# Patient Record
Sex: Female | Born: 1960 | Race: White | Hispanic: Yes | Marital: Married | Smoking: Never smoker
Health system: Southern US, Community
[De-identification: ages and names within clinical notes are randomized; demographics above are authoritative.]

## PROBLEM LIST (undated history)

## (undated) DIAGNOSIS — T7840XA Allergy, unspecified, initial encounter: Secondary | ICD-10-CM

## (undated) DIAGNOSIS — J45909 Unspecified asthma, uncomplicated: Secondary | ICD-10-CM

## (undated) HISTORY — DX: Allergy, unspecified, initial encounter: T78.40XA

## (undated) HISTORY — DX: Unspecified asthma, uncomplicated: J45.909

---

## 1992-10-17 HISTORY — PX: TUBAL LIGATION: SHX77

## 2004-04-13 ENCOUNTER — Encounter: Admission: RE | Admit: 2004-04-13 | Discharge: 2004-04-13 | Payer: Self-pay | Admitting: Obstetrics and Gynecology

## 2004-05-03 ENCOUNTER — Other Ambulatory Visit: Admission: RE | Admit: 2004-05-03 | Discharge: 2004-05-03 | Payer: Self-pay | Admitting: Obstetrics and Gynecology

## 2005-12-06 ENCOUNTER — Encounter: Admission: RE | Admit: 2005-12-06 | Discharge: 2005-12-06 | Payer: Self-pay | Admitting: Otolaryngology

## 2008-05-30 ENCOUNTER — Other Ambulatory Visit: Admission: RE | Admit: 2008-05-30 | Discharge: 2008-05-30 | Payer: Self-pay | Admitting: Family Medicine

## 2009-06-02 ENCOUNTER — Other Ambulatory Visit: Admission: RE | Admit: 2009-06-02 | Discharge: 2009-06-02 | Payer: Self-pay | Admitting: Family Medicine

## 2010-07-20 ENCOUNTER — Other Ambulatory Visit: Admission: RE | Admit: 2010-07-20 | Discharge: 2010-07-20 | Payer: Self-pay | Admitting: Family Medicine

## 2010-08-02 ENCOUNTER — Encounter: Admission: RE | Admit: 2010-08-02 | Discharge: 2010-08-02 | Payer: Self-pay | Admitting: Family Medicine

## 2012-05-28 ENCOUNTER — Other Ambulatory Visit: Payer: Self-pay | Admitting: Family Medicine

## 2012-05-28 DIAGNOSIS — Z1231 Encounter for screening mammogram for malignant neoplasm of breast: Secondary | ICD-10-CM

## 2012-06-13 ENCOUNTER — Ambulatory Visit
Admission: RE | Admit: 2012-06-13 | Discharge: 2012-06-13 | Disposition: A | Discharge status: Home / Self Care | Payer: Managed Care, Other (non HMO) | Source: Ambulatory Visit | Attending: Family Medicine | Admitting: Family Medicine

## 2012-06-13 DIAGNOSIS — Z1231 Encounter for screening mammogram for malignant neoplasm of breast: Secondary | ICD-10-CM

## 2013-07-03 ENCOUNTER — Emergency Department (HOSPITAL_COMMUNITY): Payer: Managed Care, Other (non HMO)

## 2013-07-03 ENCOUNTER — Encounter (HOSPITAL_COMMUNITY): Payer: Self-pay | Admitting: Emergency Medicine

## 2013-07-03 ENCOUNTER — Emergency Department (HOSPITAL_COMMUNITY)
Admission: EM | Admit: 2013-07-03 | Discharge: 2013-07-04 | Disposition: A | Discharge status: Home / Self Care | Payer: Managed Care, Other (non HMO) | Attending: Emergency Medicine | Admitting: Emergency Medicine

## 2013-07-03 DIAGNOSIS — S4980XA Other specified injuries of shoulder and upper arm, unspecified arm, initial encounter: Secondary | ICD-10-CM | POA: Insufficient documentation

## 2013-07-03 DIAGNOSIS — S8990XA Unspecified injury of unspecified lower leg, initial encounter: Secondary | ICD-10-CM | POA: Insufficient documentation

## 2013-07-03 DIAGNOSIS — Y9389 Activity, other specified: Secondary | ICD-10-CM | POA: Insufficient documentation

## 2013-07-03 DIAGNOSIS — Z79899 Other long term (current) drug therapy: Secondary | ICD-10-CM | POA: Insufficient documentation

## 2013-07-03 DIAGNOSIS — S0993XA Unspecified injury of face, initial encounter: Secondary | ICD-10-CM | POA: Insufficient documentation

## 2013-07-03 DIAGNOSIS — Z7982 Long term (current) use of aspirin: Secondary | ICD-10-CM | POA: Insufficient documentation

## 2013-07-03 DIAGNOSIS — Z91040 Latex allergy status: Secondary | ICD-10-CM | POA: Insufficient documentation

## 2013-07-03 DIAGNOSIS — Z3202 Encounter for pregnancy test, result negative: Secondary | ICD-10-CM | POA: Insufficient documentation

## 2013-07-03 DIAGNOSIS — S46909A Unspecified injury of unspecified muscle, fascia and tendon at shoulder and upper arm level, unspecified arm, initial encounter: Secondary | ICD-10-CM | POA: Insufficient documentation

## 2013-07-03 DIAGNOSIS — Y92410 Unspecified street and highway as the place of occurrence of the external cause: Secondary | ICD-10-CM | POA: Insufficient documentation

## 2013-07-03 DIAGNOSIS — IMO0002 Reserved for concepts with insufficient information to code with codable children: Secondary | ICD-10-CM | POA: Insufficient documentation

## 2013-07-03 LAB — POCT PREGNANCY, URINE: Preg Test, Ur: NEGATIVE

## 2013-07-03 MED ORDER — IOHEXOL 300 MG/ML  SOLN
80.0000 mL | Freq: Once | INTRAMUSCULAR | Status: AC | PRN
  Administered 2013-07-03: 80 mL via INTRAVENOUS

## 2013-07-03 MED ORDER — MORPHINE SULFATE 4 MG/ML IJ SOLN
4.0000 mg | Freq: Once | INTRAMUSCULAR | Status: DC
  Filled 2013-07-03: qty 1

## 2013-07-03 MED ORDER — ONDANSETRON HCL 4 MG/2ML IJ SOLN
4.0000 mg | Freq: Once | INTRAMUSCULAR | Status: AC
Start: 2013-07-03 — End: 2013-07-03
  Administered 2013-07-03: 4 mg via INTRAVENOUS
  Filled 2013-07-03: qty 2

## 2013-07-03 MED ORDER — HYDROMORPHONE HCL PF 1 MG/ML IJ SOLN
1.0000 mg | Freq: Once | INTRAMUSCULAR | Status: AC
  Administered 2013-07-03: 1 mg via INTRAVENOUS
  Filled 2013-07-03: qty 1

## 2013-07-03 NOTE — ED Notes (Signed)
Pt vomiting. Sat up in bed to avoid aspiration.
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
## 2013-07-03 NOTE — ED Notes (Signed)
Rn attempted IV start. 2nd Rn will try for access.
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
## 2013-07-04 MED ORDER — ONDANSETRON 4 MG PO TBDP: 4.0000 mg | ORAL_TABLET | Freq: Three times a day (TID) | ORAL | Status: DC | PRN

## 2013-07-04 MED ORDER — OXYCODONE-ACETAMINOPHEN 5-325 MG PO TABS: 1.0000 | ORAL_TABLET | ORAL | Status: DC | PRN

## 2013-07-05 NOTE — ED Provider Notes (Signed)
Medical screening examination/treatment/procedure(s) were performed by non-physician practitioner and as supervising physician I was immediately available for consultation/collaboration.   Shelda Jakes, MD 07/05/13 [phone]

## 2013-08-02 ENCOUNTER — Other Ambulatory Visit: Payer: Self-pay | Admitting: Family Medicine

## 2013-08-02 ENCOUNTER — Other Ambulatory Visit (HOSPITAL_COMMUNITY)
Admission: RE | Admit: 2013-08-02 | Discharge: 2013-08-02 | Disposition: A | Discharge status: Home / Self Care | Payer: Managed Care, Other (non HMO) | Source: Ambulatory Visit | Attending: Family Medicine | Admitting: Family Medicine

## 2013-08-02 DIAGNOSIS — Z124 Encounter for screening for malignant neoplasm of cervix: Secondary | ICD-10-CM | POA: Insufficient documentation

## 2013-08-02 DIAGNOSIS — Z1151 Encounter for screening for human papillomavirus (HPV): Secondary | ICD-10-CM | POA: Insufficient documentation

## 2014-07-23 ENCOUNTER — Other Ambulatory Visit: Payer: Self-pay

## 2014-07-23 DIAGNOSIS — Z1231 Encounter for screening mammogram for malignant neoplasm of breast: Secondary | ICD-10-CM

## 2014-07-24 ENCOUNTER — Ambulatory Visit: Payer: Managed Care, Other (non HMO)

## 2014-08-01 ENCOUNTER — Ambulatory Visit
Admission: RE | Admit: 2014-08-01 | Discharge: 2014-08-01 | Disposition: A | Discharge status: Home / Self Care | Payer: Managed Care, Other (non HMO) | Source: Ambulatory Visit

## 2014-08-01 DIAGNOSIS — Z1231 Encounter for screening mammogram for malignant neoplasm of breast: Secondary | ICD-10-CM

## 2014-08-06 ENCOUNTER — Other Ambulatory Visit: Payer: Self-pay | Admitting: Family Medicine

## 2014-08-06 DIAGNOSIS — R928 Other abnormal and inconclusive findings on diagnostic imaging of breast: Secondary | ICD-10-CM

## 2014-08-18 ENCOUNTER — Ambulatory Visit
Admission: RE | Admit: 2014-08-18 | Discharge: 2014-08-18 | Disposition: A | Discharge status: Home / Self Care | Payer: Managed Care, Other (non HMO) | Source: Ambulatory Visit | Attending: Family Medicine | Admitting: Family Medicine

## 2014-08-18 DIAGNOSIS — R928 Other abnormal and inconclusive findings on diagnostic imaging of breast: Secondary | ICD-10-CM

## 2014-12-21 ENCOUNTER — Ambulatory Visit (INDEPENDENT_AMBULATORY_CARE_PROVIDER_SITE_OTHER): Payer: Managed Care, Other (non HMO) | Admitting: Physician Assistant

## 2014-12-21 VITALS — BP 122/78 | HR 78 | Temp 99.5°F | Resp 18 | Ht 60.0 in | Wt 162.0 lb

## 2014-12-21 DIAGNOSIS — J029 Acute pharyngitis, unspecified: Secondary | ICD-10-CM

## 2014-12-21 DIAGNOSIS — J019 Acute sinusitis, unspecified: Secondary | ICD-10-CM

## 2014-12-21 LAB — POCT RAPID STREP A (OFFICE): RAPID STREP A SCREEN: NEGATIVE

## 2014-12-21 MED ORDER — BENZONATATE 100 MG PO CAPS: 100.0000 mg | ORAL_CAPSULE | Freq: Three times a day (TID) | ORAL | Status: DC | PRN

## 2014-12-21 MED ORDER — IPRATROPIUM BROMIDE 0.03 % NA SOLN: 2.0000 | Freq: Two times a day (BID) | NASAL | Status: DC

## 2014-12-21 NOTE — Progress Notes (Signed)
Subjective:    Patient ID: Karen Ellis, female    DOB: [birth year], 54 y.o.   MRN: [id number]  HPI Patient presents for sore throat and fever that have been present for 2 days. Sore throat has gotten worse to the point that she doesn't want to drink water due to pain. Fevers have been 101 and 102 degrees and have improved with Tylenol, but have not resolved. This morning now has sinus pressure, right ear pressure, has some cough and congestion. 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 Has additionally tried Mucinex with minimal relief. Has h/o allergies, but not asthma. Is not a smoker. Med allergies to morphine and latex.    Review of Systems  Constitutional: Positive for fever and fatigue. Negative for chills, diaphoresis and appetite change.  HENT: Positive for congestion, ear pain, sinus pressure, sore throat and trouble swallowing. Negative for postnasal drip, rhinorrhea and sneezing.   Respiratory: Positive for cough. Negative for shortness of breath.   Cardiovascular: Negative for chest pain.  Gastrointestinal: Negative for nausea and vomiting.  Musculoskeletal: Negative for neck pain and neck stiffness.  Allergic/Immunologic: Positive for environmental allergies. Negative for food allergies.  Neurological: Positive for headaches.  Hematological: Positive for adenopathy.       Objective:   Physical Exam  Constitutional: She is oriented to person, place, and time. She appears well-developed and well-nourished. No distress.  Blood pressure 122/78, pulse 78, temperature 99.5 F (37.5 C), temperature source Oral, resp. rate 18, height 5' (1.524 m), weight 162 lb (73.483 kg), SpO2 97 %.  HENT:  Head: Normocephalic and atraumatic.  Right Ear: Tympanic membrane, external ear and ear canal normal. No swelling or tenderness. Tympanic membrane is not injected, not perforated, not erythematous, not retracted and not bulging. No middle ear effusion.  Left Ear:  External ear and ear canal normal. No swelling or tenderness. Tympanic membrane is erythematous. Tympanic membrane is not injected, not perforated, not retracted and not bulging.  No middle ear effusion.  Nose: Rhinorrhea (with moderate erythema) present. No sinus tenderness. Right sinus exhibits maxillary sinus tenderness. Right sinus exhibits no frontal sinus tenderness. Left sinus exhibits maxillary sinus tenderness. Left sinus exhibits no frontal sinus tenderness.  Mouth/Throat: Uvula is midline and mucous membranes are normal. Posterior oropharyngeal erythema (mild) present. No oropharyngeal exudate or posterior oropharyngeal edema.  2+ hypertrophic tonsils bilaterally without exudate.  Eyes: Conjunctivae are normal. Pupils are equal, round, and reactive to light. Right eye exhibits no discharge. Left eye exhibits no discharge. No scleral icterus.  Neck: Normal range of motion. Neck supple. No thyromegaly present.  Cardiovascular: Normal rate, regular rhythm and normal heart sounds.  Exam reveals no gallop and no friction rub.   No murmur heard. Pulmonary/Chest: Effort normal and breath sounds normal. No respiratory distress. She has no decreased breath sounds. She has no wheezes. She has no rhonchi. She has no rales.  Lymphadenopathy:    She has cervical adenopathy.  Neurological: She is alert and oriented to person, place, and time.  Skin: Skin is warm and dry. No rash noted. She is not diaphoretic. No erythema. No pallor.   Results for orders placed or performed in visit on 12/21/14  POCT rapid strep A  Result Value Ref Range   Rapid Strep A Screen Negative Negative      Assessment & Plan:  1. Sore throat - POCT rapid strep A - Culture, Group A Strep  2. Acute sinusitis, recurrence not specified, unspecified  location Can use ibuprofen for fevers and pain. Continue Mucinex with plenty of water. - ipratropium (ATROVENT) 0.03 % nasal spray; Place 2 sprays into both nostrils 2 (two)  times daily.  Dispense: 30 mL; Refill: 0 - benzonatate (TESSALON) 100 MG capsule; Take 1-2 capsules (100-200 mg total) by mouth 3 (three) times daily as needed for cough.  Dispense: 40 capsule; Refill: 0   Dejuan Elman PA-C  [facility] 12/21/2014 9:19 AM

## 2014-12-21 NOTE — Patient Instructions (Signed)

## 2014-12-22 LAB — CULTURE, GROUP A STREP: ORGANISM ID, BACTERIA: NORMAL

## 2014-12-23 ENCOUNTER — Encounter: Payer: Self-pay | Admitting: Physician Assistant

## 2015-07-28 ENCOUNTER — Other Ambulatory Visit: Payer: Self-pay

## 2015-07-28 ENCOUNTER — Other Ambulatory Visit: Payer: Self-pay | Admitting: Family Medicine

## 2015-07-28 DIAGNOSIS — Z1231 Encounter for screening mammogram for malignant neoplasm of breast: Secondary | ICD-10-CM

## 2015-08-21 ENCOUNTER — Ambulatory Visit
Admission: RE | Admit: 2015-08-21 | Discharge: 2015-08-21 | Disposition: A | Discharge status: Home / Self Care | Payer: Managed Care, Other (non HMO) | Source: Ambulatory Visit

## 2015-08-21 DIAGNOSIS — Z1231 Encounter for screening mammogram for malignant neoplasm of breast: Secondary | ICD-10-CM

## 2015-08-21 LAB — BASIC METABOLIC PANEL
BUN: 11 mg/dL (ref 4–21)
Creatinine: 0.7 mg/dL (ref 0.5–1.1)
GLUCOSE: 89 mg/dL
POTASSIUM: 4.6 mmol/L (ref 3.4–5.3)
SODIUM: 140 mmol/L (ref 137–147)

## 2015-08-21 LAB — LIPID PANEL
Cholesterol: 203 mg/dL — AB (ref 0–200)
HDL: 68 mg/dL (ref 35–70)
LDL Cholesterol: 116 mg/dL
Triglycerides: 94 mg/dL (ref 40–160)

## 2015-08-21 LAB — HEPATIC FUNCTION PANEL
ALT: 27 U/L (ref 7–35)
AST: 24 U/L (ref 13–35)
Bilirubin, Total: 0.4 mg/dL

## 2015-08-26 ENCOUNTER — Other Ambulatory Visit: Payer: Self-pay | Admitting: Family Medicine

## 2015-08-26 DIAGNOSIS — R928 Other abnormal and inconclusive findings on diagnostic imaging of breast: Secondary | ICD-10-CM

## 2015-09-18 ENCOUNTER — Other Ambulatory Visit: Payer: Managed Care, Other (non HMO)

## 2015-10-01 ENCOUNTER — Ambulatory Visit
Admission: RE | Admit: 2015-10-01 | Discharge: 2015-10-01 | Disposition: A | Discharge status: Home / Self Care | Payer: Managed Care, Other (non HMO) | Source: Ambulatory Visit | Attending: Family Medicine | Admitting: Family Medicine

## 2015-10-01 DIAGNOSIS — R928 Other abnormal and inconclusive findings on diagnostic imaging of breast: Secondary | ICD-10-CM

## 2017-01-30 ENCOUNTER — Encounter: Payer: Self-pay | Admitting: Family Medicine

## 2017-01-30 ENCOUNTER — Ambulatory Visit (INDEPENDENT_AMBULATORY_CARE_PROVIDER_SITE_OTHER): Payer: Managed Care, Other (non HMO) | Admitting: Family Medicine

## 2017-01-30 VITALS — BP 118/70 | HR 68 | Temp 98.0°F | Resp 12 | Ht 60.0 in | Wt 169.2 lb

## 2017-01-30 DIAGNOSIS — M25541 Pain in joints of right hand: Secondary | ICD-10-CM

## 2017-01-30 DIAGNOSIS — E559 Vitamin D deficiency, unspecified: Secondary | ICD-10-CM | POA: Diagnosis not present

## 2017-01-30 DIAGNOSIS — E6609 Other obesity due to excess calories: Secondary | ICD-10-CM

## 2017-01-30 DIAGNOSIS — Z6833 Body mass index (BMI) 33.0-33.9, adult: Secondary | ICD-10-CM | POA: Diagnosis not present

## 2017-01-30 DIAGNOSIS — E669 Obesity, unspecified: Secondary | ICD-10-CM | POA: Insufficient documentation

## 2017-01-30 LAB — TSH: TSH: 1.59 u[IU]/mL (ref 0.35–4.50)

## 2017-01-30 LAB — C-REACTIVE PROTEIN: CRP: 0.3 mg/dL — ABNORMAL LOW (ref 0.5–20.0)

## 2017-01-30 LAB — SEDIMENTATION RATE: Sed Rate: 6 mm/hr (ref 0–30)

## 2017-01-30 LAB — VITAMIN D 25 HYDROXY (VIT D DEFICIENCY, FRACTURES): VITD: 48.25 ng/mL (ref 30.00–100.00)

## 2017-01-30 NOTE — Progress Notes (Signed)
Pre visit review using our clinic review tool, if applicable. No additional management support is needed unless otherwise documented below in the visit note. 

## 2017-01-30 NOTE — Patient Instructions (Addendum)
WE NOW OFFER    [facility] FAST TRACK!!!  SAME DAY Appointments for ACUTE CARE  Such as: Sprains, Injuries, cuts, abrasions, rashes, muscle pain, joint pain, back pain Colds, flu, sore throats, headache, allergies, cough, fever  Ear pain, sinus and eye infections Abdominal pain, nausea, vomiting, diarrhea, upset stomach Animal/insect bites  3 Easy Ways to Schedule: Walk-In Scheduling Call in scheduling Mychart Sign-up: [email or web address]   A few things to remember from today's visit:   Arthralgia of right hand - Plan: TSH, Sedimentation rate, C-reactive protein, Rheumatoid factor, Cyclic citrul peptide antibody, IgG  Vitamin D deficiency - Plan: VITAMIN D 25 Hydroxy (Vit-D Deficiency, Fractures)  ? Osteoarthritis.  Osteoarthritis is a chronic condition and gets worse with age.  The following may help:  Over the counter topical medications: Icy Hot or Asper cream with Lidocaine. Tai Chi or PT. Fall prevention. Avoid weight gain. Fish oil, over the counter Megared for example, 2 capsules daily.   Tumeric caps     Please be sure medication list is accurate. If a new problem present, please set up appointment sooner than planned today.

## 2017-01-30 NOTE — Progress Notes (Signed)
HPI:   [name] is a 56 y.o. female, who is here today to establish care.  Former PCP: Dr Yehuda Mao Last preventive routine visit: 2016. Colonoscopy at 56 yo, 10 years f/u recommended.  Chronic medical problems: Allergies, asthma, and Vit D deficiency among some.  Vit D deficiency,she is on OTC Vit D 1000 U daily, which she started after completing 8 weeks of Ergocalciferol 50,000 U weekly.   Concerns today:   4 days of joint pain: wrist and PIP joint of both hands, R>L. Mild edema on some joints. She denies erythema or limitations of ROM. Pain exacerbated by movement and alleviated by rest. + Stiffness,worse in the morning and alleviated by movement. No Hx of injuries. OTC Tylenol has helped. No Hx of OA. Pain is like achy,it was 8/10 and now 4/10.   She tries to follow a healthy diet,does not exercise regularly. She denies Hx of HTN,DM,or HLD.    Review of Systems  Constitutional: Negative for activity change, appetite change, fatigue, fever and unexpected weight change.  HENT: Negative for mouth sores, nosebleeds and trouble swallowing.   Eyes: Negative for redness and visual disturbance.  Respiratory: Negative for cough, shortness of breath and wheezing.   Cardiovascular: Negative for chest pain, palpitations and leg swelling.  Gastrointestinal: Negative for abdominal pain, nausea and vomiting.       Negative for changes in bowel habits.  Endocrine: Negative for cold intolerance and heat intolerance.  Genitourinary: Negative for decreased urine volume and hematuria.  Musculoskeletal: Positive for arthralgias and joint swelling. Negative for back pain, gait problem and myalgias.  Skin: Negative for rash.  Allergic/Immunologic: Positive for environmental allergies.  Neurological: Negative for syncope, weakness, numbness and headaches.  Psychiatric/Behavioral: Negative for confusion and sleep disturbance. The patient is not nervous/anxious.        Current Outpatient Prescriptions on File Prior to Visit  Medication Sig Dispense Refill  . aspirin EC 81 MG tablet Take 81 mg by mouth daily.    . cholecalciferol (VITAMIN D) 1000 UNITS tablet Take 1,000 Units by mouth daily.    . fish oil-omega-3 fatty acids 1000 MG capsule Take 1 g by mouth daily.    Marland Kitchen loratadine (CLARITIN) 10 MG tablet Take 10 mg by mouth as needed for allergies.     No current facility-administered medications on file prior to visit.      Past Medical History:  Diagnosis Date  . Allergy   . Asthma    Allergies  Allergen Reactions  . Morphine And Related   . Latex Rash    Family History  Problem Relation Age of Onset  . Heart disease Father 64    CAD  . Heart disease Brother     arrhythmias  . Heart disease Daughter     CAD  . Heart disease Brother 68    CAD    Social History   Social History  . Marital status: Married    Spouse name: N/A  . Number of children: N/A  . Years of education: N/A   Social History Main Topics  . Smoking status: Never Smoker  . Smokeless tobacco: Never Used  . Alcohol use Yes     Comment: occasionally  . Drug use: No  . Sexual activity: Not Asked   Other Topics Concern  . None   Social History Narrative  . None    Vitals:   01/30/17 0948  BP: 118/70  Pulse: 68  Resp: 12  Temp: 98 F (36.7 C)   Body mass index is 33.04 kg/m.   Physical Exam  Nursing note and vitals reviewed. Constitutional: She is oriented to person, place, and time. She appears well-developed. No distress.  HENT:  Head: Atraumatic.  Mouth/Throat: Oropharynx is clear and moist and mucous membranes are normal.  Eyes: Conjunctivae and EOM are normal. Pupils are equal, round, and reactive to light.  Cardiovascular: Normal rate and regular rhythm.   No murmur heard. Pulses:      Dorsalis pedis pulses are 2+ on the right side, and 2+ on the left side.  Respiratory: Effort normal and breath sounds normal. No respiratory  distress.  GI: Soft. She exhibits no mass. There is no hepatomegaly. There is no tenderness.  Musculoskeletal: She exhibits no edema.       Right hand: She exhibits tenderness. She exhibits normal range of motion.       Left hand: She exhibits tenderness. She exhibits normal range of motion. Decreased sensation is not present in the ulnar distribution and is not present in the radial distribution.  Wrist no tenderness with ROM or palpation. No signs of synovitis. Some IP joints with palpable nodular lesions. No muscle atrophy appreciated.  Lymphadenopathy:    She has no cervical adenopathy.  Neurological: She is alert and oriented to person, place, and time. She has normal strength. Coordination and gait normal.  Skin: Skin is warm. No rash noted. No erythema.  Psychiatric: She has a normal mood and affect.  Well groomed, good eye contact.    ASSESSMENT AND PLAN:   Karen Ellis was seen today for establish care.  Diagnoses and all orders for this visit:  Arthralgia of right hand  We discussed possible etiologies. ? OA. Continue Tylenol 650 mg tid as needed. Further recommendations will be given according to lab results.  -     TSH -     Sedimentation rate -     C-reactive protein -     Rheumatoid factor -     Cyclic citrul peptide antibody, IgG  Vitamin D deficiency  No changes in current management, will follow labs done today and will give further recommendations accordingly. F/U in 6-12 months.  -     VITAMIN D 25 Hydroxy (Vit-D Deficiency, Fractures)  Class 1 obesity due to excess calories without serious comorbidity with body mass index (BMI) of 33.0 to 33.9 in adult  We discussed benefits of wt loss as well as adverse effects of obesity, more so given her strong FHx for CVD Consistency with healthy diet and physical activity recommended. Daily brisk walking for 15-30 min as tolerated.     Karen Ellis G. [place], MD  [facility]. [place]  office.

## 2017-01-31 ENCOUNTER — Encounter: Payer: Self-pay | Admitting: Family Medicine

## 2017-01-31 LAB — CYCLIC CITRUL PEPTIDE ANTIBODY, IGG: Cyclic Citrullin Peptide Ab: <16 Units

## 2017-01-31 LAB — RHEUMATOID FACTOR

## 2017-03-26 NOTE — Progress Notes (Signed)
HPI:   Karen Ellis is a 55 y.o. female, who is here today for her routine physical.  Regular exercise 3 or more time per week: Started back in 01/2017 Following a healthy diet: Improved her diet, has noted wt loss.  She lives with husbands.  Chronic medical problems: Vit D deficiency,obesity, asthma,allergies, and right hand arthralgias. Last seen on 01/30/17.   Pap smear 07/2013 negative with HPV neg. Hx of abnormal pap smears: .28 years ago post partum abnormal pap smear and copoBx neg. No abnormalities since then. Hx of STD's: Negative.  Sexually active.  Immunization History  Administered Date(s) Administered  . Pneumococcal Polysaccharide-23 07/09/2012  . Tdap 05/25/2007    Mammogram: [facility] 1 in 09/2015. Received letter and has appt in 04/2017. Colonoscopy: 7 years ago, reported as negative. DEXA: 2009, negative.  Hep C screening: Not done in the past. Last eye exam 01/2016, eye glasses for driving. Dental app in 05/2017.   Review of Systems  Constitutional: Negative for activity change, appetite change, fatigue and fever.  HENT: Negative for dental problem, hearing loss, mouth sores, trouble swallowing and voice change.   Eyes: Negative for redness and visual disturbance.  Respiratory: Negative for cough, shortness of breath and wheezing.   Cardiovascular: Negative for chest pain, palpitations and leg swelling.  Gastrointestinal: Negative for abdominal pain, blood in stool, nausea and vomiting.       No changes in bowel habits.  Endocrine: Negative for cold intolerance, heat intolerance, polydipsia, polyphagia and polyuria.  Genitourinary: Negative for decreased urine volume, dysuria, hematuria, vaginal bleeding and vaginal discharge.  Musculoskeletal: Negative for gait problem and myalgias.  Skin: Negative for color change and rash.  Allergic/Immunologic: Positive for environmental allergies.  Neurological: Negative for syncope, weakness and  headaches.  Hematological: Negative for adenopathy. Does not bruise/bleed easily.  Psychiatric/Behavioral: Negative for confusion and sleep disturbance. The patient is not nervous/anxious.   All other systems reviewed and are negative.     Current Outpatient Prescriptions on File Prior to Visit  Medication Sig Dispense Refill  . albuterol (PROAIR HFA) 108 (90 Base) MCG/ACT inhaler Inhale into the lungs.    Marland Kitchen aspirin EC 81 MG tablet Take 81 mg by mouth daily.    . cholecalciferol (VITAMIN D) 1000 UNITS tablet Take 1,000 Units by mouth daily.    . fish oil-omega-3 fatty acids 1000 MG capsule Take 1 g by mouth daily.    Marland Kitchen loratadine (CLARITIN) 10 MG tablet Take 10 mg by mouth as needed for allergies.     No current facility-administered medications on file prior to visit.      Past Medical History:  Diagnosis Date  . Allergy   . Asthma     Allergies  Allergen Reactions  . Morphine And Related   . Latex Rash    Family History  Problem Relation Age of Onset  . Heart disease Father 31       CAD  . Heart disease Brother        arrhythmias  . Heart disease Daughter        CAD  . Heart disease Brother 53       CAD    Social History   Social History  . Marital status: Married    Spouse name: N/A  . Number of children: N/A  . Years of education: N/A   Social History Main Topics  . Smoking status: Never Smoker  . Smokeless tobacco: Never Used  . Alcohol  use Yes     Comment: occasionally  . Drug use: No  . Sexual activity: Not Asked   Other Topics Concern  . None   Social History Narrative  . None    Vitals:   03/27/17 0954  BP: 118/80  Pulse: 70  Resp: 12   Body mass index is 32.05 kg/m.  O2 sat at RA 97%  Wt Readings from Last 3 Encounters:  03/27/17 164 lb 2 oz (74.4 kg)  01/30/17 169 lb 3.2 oz (76.7 kg)  12/21/14 162 lb (73.5 kg)    Physical Exam  Nursing note and vitals reviewed. Constitutional: She is oriented to person, place, and time.  She appears well-developed. No distress.  HENT:  Head: Atraumatic.  Right Ear: Hearing, tympanic membrane, external ear and ear canal normal.  Left Ear: Hearing, external ear and ear canal normal. Tympanic membrane is scarred (mild).  Mouth/Throat: Uvula is midline, oropharynx is clear and moist and mucous membranes are normal.  Eyes: Conjunctivae and EOM are normal. Pupils are equal, round, and reactive to light.  Neck: No tracheal deviation present. No thyroid mass and no thyromegaly present.  Cardiovascular: Normal rate and regular rhythm.   No murmur heard. Pulses:      Dorsalis pedis pulses are 2+ on the right side, and 2+ on the left side.  Respiratory: Effort normal and breath sounds normal. No respiratory distress.  GI: Soft. She exhibits no mass. There is no hepatomegaly. There is no tenderness.  Genitourinary: No breast swelling or tenderness.  Genitourinary Comments: Breast: no masses,skin changes, or nipple discharge appreciated.  Musculoskeletal: She exhibits no edema.  No major deformity or signs of synovitis appreciated.  Lymphadenopathy:    She has no cervical adenopathy.    She has no axillary adenopathy.       Right: No supraclavicular adenopathy present.       Left: No supraclavicular adenopathy present.  Neurological: She is alert and oriented to person, place, and time. She has normal strength. No cranial nerve deficit. Coordination and gait normal.  Reflex Scores:      Bicep reflexes are 2+ on the right side and 2+ on the left side.      Patellar reflexes are 2+ on the right side and 2+ on the left side. Skin: Skin is warm. No rash noted. No erythema.  Psychiatric: She has a normal mood and affect. Her speech is normal.  Well groomed, good eye contact.      ASSESSMENT AND PLAN:   Karen Ellis was seen today for annual exam.  Diagnoses and all orders for this visit:  Lab Results  Component Value Date   CHOL 180 03/27/2017   HDL 48.20 03/27/2017   LDLCALC 102  (H) 03/27/2017   TRIG 149.0 03/27/2017   CHOLHDL 4 03/27/2017   Lab Results  Component Value Date   CREATININE 0.63 03/27/2017   BUN 12 03/27/2017   NA 138 03/27/2017   K 4.0 03/27/2017   CL 103 03/27/2017   CO2 27 03/27/2017    Routine general medical examination at a health care facility   We discussed the importance of regular physical activity and healthy diet for prevention of chronic illness and/or complications. Preventive guidelines reviewed. Vaccination up to date, due for Tdap and Pneumovax in 06/2017.  Ca++ and vit D supplementation recommended. Next CPE in 1 year.   -     Basic metabolic panel -     Lipid panel  Lipid screening -  Lipid panel  Diabetes mellitus screening -     Basic metabolic panel  Breast cancer screening  She has appt for mammogram in 04/2017.  Colon cancer screening  Immunochemical stool cards given x 3.  Encounter for HCV screening test for high risk patient -     Hepatitis C antibody screen     Return in 1 year (on 03/27/2018) for CPE with pap smear..      Betty G. [place], MD  [facility]. [place] office.

## 2017-03-27 ENCOUNTER — Ambulatory Visit (INDEPENDENT_AMBULATORY_CARE_PROVIDER_SITE_OTHER): Payer: Managed Care, Other (non HMO) | Admitting: Family Medicine

## 2017-03-27 ENCOUNTER — Encounter: Payer: Self-pay | Admitting: Family Medicine

## 2017-03-27 VITALS — BP 118/80 | HR 70 | Resp 12 | Ht 60.0 in | Wt 164.1 lb

## 2017-03-27 DIAGNOSIS — Z1159 Encounter for screening for other viral diseases: Secondary | ICD-10-CM | POA: Diagnosis not present

## 2017-03-27 DIAGNOSIS — Z1231 Encounter for screening mammogram for malignant neoplasm of breast: Secondary | ICD-10-CM

## 2017-03-27 DIAGNOSIS — Z1211 Encounter for screening for malignant neoplasm of colon: Secondary | ICD-10-CM | POA: Diagnosis not present

## 2017-03-27 DIAGNOSIS — Z0000 Encounter for general adult medical examination without abnormal findings: Secondary | ICD-10-CM | POA: Diagnosis not present

## 2017-03-27 DIAGNOSIS — Z9189 Other specified personal risk factors, not elsewhere classified: Secondary | ICD-10-CM

## 2017-03-27 DIAGNOSIS — Z131 Encounter for screening for diabetes mellitus: Secondary | ICD-10-CM | POA: Diagnosis not present

## 2017-03-27 DIAGNOSIS — Z13220 Encounter for screening for lipoid disorders: Secondary | ICD-10-CM

## 2017-03-27 DIAGNOSIS — Z1239 Encounter for other screening for malignant neoplasm of breast: Secondary | ICD-10-CM

## 2017-03-27 LAB — BASIC METABOLIC PANEL
BUN: 12 mg/dL (ref 6–23)
CO2: 27 meq/L (ref 19–32)
CREATININE: 0.63 mg/dL (ref 0.40–1.20)
Calcium: 9.8 mg/dL (ref 8.4–10.5)
Chloride: 103 mEq/L (ref 96–112)
GFR: 103.22 mL/min (ref >60.00)
Glucose, Bld: 82 mg/dL (ref 70–99)
Potassium: 4 mEq/L (ref 3.5–5.1)
Sodium: 138 mEq/L (ref 135–145)

## 2017-03-27 LAB — LIPID PANEL
CHOL/HDL RATIO: 4
Cholesterol: 180 mg/dL (ref 0–200)
HDL: 48.2 mg/dL (ref >39.00)
LDL Cholesterol: 102 mg/dL — ABNORMAL HIGH (ref 0–99)
NonHDL: 131.76
Triglycerides: 149 mg/dL (ref 0.0–149.0)
VLDL: 29.8 mg/dL (ref 0.0–40.0)

## 2017-03-27 NOTE — Patient Instructions (Addendum)
A few things to remember from today's visit:   Lipid screening - Plan: Lipid panel  Diabetes mellitus screening - Plan: Basic metabolic panel  Breast cancer screening  Colon cancer screening  Encounter for HCV screening test for high risk patient - Plan: Hepatitis C antibody screen  Routine general medical examination at a health care facility - Plan: Basic metabolic panel, Lipid panel    At least 150 minutes of moderate exercise per week, daily brisk walking for 15-30 min is a good exercise option. Healthy diet low in saturated (animal) fats and sweets and consisting of fresh fruits and vegetables, lean meats such as fish and white chicken and whole grains.   - Vaccines:  Tdap vaccine every 10 years. In 06/2017 Pneumovax (asthma) and Tdap.   Shingles vaccine recommended at age 58, could be given after 58 years of age but not sure about insurance coverage.  Pneumonia vaccines:  Prevnar 13 at 65 and Pneumovax at 66.  Screening recommendations for low/normal risk women:  Screening for diabetes at age 58-45 and every 3 years.  Cervical cancer prevention:   Due in 2019.   -Breast cancer: Mammogram: There is disagreement between experts about when to start screening in low risk asymptomatic female but recent recommendations are to start screening at 2640 and not later than 58 years old , every 1-2 years and after 58 yo q 2 years. Screening is recommended until 58 years old but some women can continue screening depending of healthy issues.   Colon cancer screening: starts at 58 years old until 58 years old.  Cholesterol disorder screening at age 58 and every 3 years.  Also recommended:  1. Dental visit- Brush and floss your teeth twice daily; visit your dentist twice a year. 2. Eye doctor- Get an eye exam at least every 2 years. 3. Helmet use- Always wear a helmet when riding a bicycle, motorcycle, rollerblading or skateboarding. 4. Safe sex- If you may be exposed to  sexually transmitted infections, use a condom. 5. Seat belts- Seat belts can save your live; always wear one. 6. Smoke/Carbon Monoxide detectors- These detectors need to be installed on the appropriate level of your home. Replace batteries at least once a year. 7. Skin cancer- When out in the sun please cover up and use sunscreen 15 SPF or higher. 8. Violence- If anyone is threatening or hurting you, please tell your healthcare provider.  9. Drink alcohol in moderation- Limit alcohol intake to one drink or less per day. Never drink and drive.  Please be sure medication list is accurate. If a new problem present, please set up appointment sooner than planned today.

## 2017-03-28 LAB — HEPATITIS C ANTIBODY: HCV AB: NEGATIVE

## 2017-03-29 ENCOUNTER — Encounter: Payer: Self-pay | Admitting: Family Medicine

## 2017-03-30 ENCOUNTER — Encounter: Payer: Self-pay | Admitting: Family Medicine

## 2017-07-06 ENCOUNTER — Encounter: Payer: Self-pay | Admitting: Family Medicine

## 2017-07-18 ENCOUNTER — Other Ambulatory Visit: Payer: Self-pay | Admitting: Family Medicine

## 2017-07-18 DIAGNOSIS — Z1239 Encounter for other screening for malignant neoplasm of breast: Secondary | ICD-10-CM

## 2017-07-26 ENCOUNTER — Ambulatory Visit: Payer: Managed Care, Other (non HMO) | Admitting: Family Medicine

## 2017-07-26 ENCOUNTER — Encounter: Payer: Self-pay | Admitting: Family Medicine

## 2017-07-26 ENCOUNTER — Ambulatory Visit (INDEPENDENT_AMBULATORY_CARE_PROVIDER_SITE_OTHER): Payer: Managed Care, Other (non HMO) | Admitting: Family Medicine

## 2017-07-26 VITALS — BP 110/80 | HR 94 | Temp 98.3°F | Resp 12 | Ht 60.0 in | Wt 169.0 lb

## 2017-07-26 DIAGNOSIS — R05 Cough: Secondary | ICD-10-CM | POA: Diagnosis not present

## 2017-07-26 DIAGNOSIS — J45901 Unspecified asthma with (acute) exacerbation: Secondary | ICD-10-CM

## 2017-07-26 DIAGNOSIS — J069 Acute upper respiratory infection, unspecified: Secondary | ICD-10-CM | POA: Diagnosis not present

## 2017-07-26 DIAGNOSIS — R059 Cough, unspecified: Secondary | ICD-10-CM

## 2017-07-26 MED ORDER — HYDROCOD POLST-CPM POLST ER 10-8 MG/5ML PO SUER
5.0000 mL | Freq: Two times a day (BID) | ORAL | 0 refills | Status: AC | PRN
Start: 2017-07-26 — End: 2017-08-05

## 2017-07-26 MED ORDER — ALBUTEROL SULFATE (2.5 MG/3ML) 0.083% IN NEBU
2.5000 mg | INHALATION_SOLUTION | Freq: Once | RESPIRATORY_TRACT | Status: AC
  Administered 2017-07-26: 2.5 mg via RESPIRATORY_TRACT

## 2017-07-26 MED ORDER — PREDNISONE 20 MG PO TABS: 40.0000 mg | ORAL_TABLET | Freq: Every day | ORAL | 0 refills | Status: DC

## 2017-07-26 MED ORDER — ALBUTEROL SULFATE HFA 108 (90 BASE) MCG/ACT IN AERS: 2.0000 | INHALATION_SPRAY | Freq: Four times a day (QID) | RESPIRATORY_TRACT | 1 refills | Status: DC | PRN

## 2017-07-26 NOTE — Progress Notes (Signed)
ACUTE VISIT  HPI:  Chief Complaint  Patient presents with  . Cough  . chest congestion    [name] is a 56 y.o.female here today complaining of 5-7 days of respiratory symptoms.  Started like a "cold" a week ago and 3 days ago started feeling worse, non productive cough and wheezing.  Cough  This is a new problem. The current episode started in the past 7 days. The problem has been gradually worsening. The cough is non-productive. Associated symptoms include chills, myalgias, nasal congestion, postnasal drip, rhinorrhea, a sore throat, shortness of breath and wheezing. Pertinent negatives include no chest pain, ear congestion, ear pain, eye redness, fever, headaches, heartburn, hemoptysis or rash. The symptoms are aggravated by exercise and lying down. She has tried a beta-agonist inhaler and OTC cough suppressant for the symptoms. The treatment provided no relief. Her past medical history is significant for asthma and environmental allergies.   No Hx of recent travel. No sick contact. No known insect bite.  Hx of allergies: Allergic rhinitis and asthma. She has used Albuterol inh at home but not sure if she still has medication, seems empty.  OTC medications for this problem: Mucinex and Tylenol.  Review of Systems  Constitutional: Positive for activity change, appetite change and chills. Negative for fatigue and fever.  HENT: Positive for congestion, postnasal drip, rhinorrhea and sore throat. Negative for ear pain, facial swelling, mouth sores, sinus pressure, sneezing, trouble swallowing and voice change.   Eyes: Negative for discharge, redness and itching.  Respiratory: Positive for cough, chest tightness, shortness of breath and wheezing. Negative for hemoptysis.   Cardiovascular: Negative for chest pain and leg swelling.  Gastrointestinal: Negative for abdominal pain, diarrhea, heartburn, nausea and vomiting.  Genitourinary: Negative for decreased  urine volume, dysuria and hematuria.  Musculoskeletal: Positive for myalgias. Negative for back pain, joint swelling and neck pain.  Skin: Negative for rash.  Allergic/Immunologic: Positive for environmental allergies.  Neurological: Negative for syncope, weakness and headaches.  Hematological: Negative for adenopathy. Does not bruise/bleed easily.  Psychiatric/Behavioral: Positive for sleep disturbance. Negative for confusion. The patient is nervous/anxious.       Current Outpatient Prescriptions on File Prior to Visit  Medication Sig Dispense Refill  . aspirin EC 81 MG tablet Take 81 mg by mouth daily.    . cholecalciferol (VITAMIN D) 1000 UNITS tablet Take 1,000 Units by mouth daily.    . fish oil-omega-3 fatty acids 1000 MG capsule Take 1 g by mouth daily.    Marland Kitchen loratadine (CLARITIN) 10 MG tablet Take 10 mg by mouth as needed for allergies.     No current facility-administered medications on file prior to visit.      Past Medical History:  Diagnosis Date  . Allergy   . Asthma    Allergies  Allergen Reactions  . Morphine And Related   . Latex Rash    Social History   Social History  . Marital status: Married    Spouse name: N/A  . Number of children: N/A  . Years of education: N/A   Social History Main Topics  . Smoking status: Never Smoker  . Smokeless tobacco: Never Used  . Alcohol use Yes     Comment: occasionally  . Drug use: No  . Sexual activity: Not Asked   Other Topics Concern  . None   Social History Narrative  . None    Vitals:   07/26/17 1144  BP: 110/80  Pulse:  94  Resp: 12  Temp: 98.3 F (36.8 C)  SpO2: 98%   Body mass index is 33.01 kg/m.   Physical Exam  Nursing note and vitals reviewed. Constitutional: She is oriented to person, place, and time. She appears well-developed. She does not appear ill. No distress.  HENT:  Head: Atraumatic.  Right Ear: Tympanic membrane, external ear and ear canal normal.  Left Ear: Tympanic  membrane, external ear and ear canal normal.  Nose: Rhinorrhea present. Right sinus exhibits no maxillary sinus tenderness and no frontal sinus tenderness. Left sinus exhibits no maxillary sinus tenderness and no frontal sinus tenderness.  Mouth/Throat: Uvula is midline and mucous membranes are normal. Posterior oropharyngeal erythema (mild) present. No oropharyngeal exudate or posterior oropharyngeal edema.  Nasal voice. Post nasal drainage. Hyperemic nasal mucosa.  Eyes: Conjunctivae are normal.  Neck: No muscular tenderness present. No edema and no erythema present.  Cardiovascular: Normal rate and regular rhythm.   No murmur heard. Respiratory: Effort normal and breath sounds normal. No stridor. No respiratory distress.  Lymphadenopathy:       Head (right side): No submandibular adenopathy present.       Head (left side): No submandibular adenopathy present.    She has no cervical adenopathy.  Neurological: She is alert and oriented to person, place, and time. She has normal strength. Gait normal.  Skin: Skin is warm. No rash noted. No erythema.  Psychiatric: Her speech is normal. Her mood appears anxious.  Well groomed, good eye contact.    ASSESSMENT AND PLAN:   Ms. Karen Ellis was seen today for cough and chest congestion.  Diagnoses and all orders for this visit:  Mild asthma with exacerbation, unspecified whether persistent  DuoNeb treatment given in the office, she tolerated well. Auscultation negative for rales and rhonchi, wheezing improved. Mild coarse breathing sounds on left base appreciated after DuoNeb treatment. CXR ordered. Prednisone 40 mg daily recommended, she understands side effects. Albuterol inh 2 puff every 6 hours for a week then as needed for wheezing or shortness of breath.  Instructed about warning signs. Follow-up in 7 days if she is not any better. Excuse note given for work.  -     albuterol (PROAIR HFA) 108 (90 Base) MCG/ACT inhaler; Inhale 2 puffs  into the lungs every 6 (six) hours as needed for wheezing or shortness of breath. -     predniSONE (DELTASONE) 20 MG tablet; Take 2 tablets (40 mg total) by mouth daily with breakfast. -     DG Chest 2 View; Future  Cough  We discussed some side effects of Tussionex, recommend taking it mainly at night to help her sleep.  -     chlorpheniramine-HYDROcodone (TUSSIONEX PENNKINETIC ER) 10-8 MG/5ML SUER; Take 5 mLs by mouth every 12 (twelve) hours as needed for cough.  URI, acute  Symptoms suggests a viral etiology, in which case symptomatic treatment is usually recommended, so I do not think abx is needed at this time. Will give further recommendations according to CXR. She agrees with plan.  Instructed to monitor for signs of complications, including new onset of fever among some, clearly instructed about warning signs. I also explained that cough and nasal congestion can last a few days and sometimes weeks. F/U as needed.    -Ms. Karen Ellis was advised to seek attention immediately if symptoms worsen or to follow if they persist or new concerns arise.       Shyia Fillingim G. [place], MD  [facility]. [place]  office.

## 2017-07-26 NOTE — Addendum Note (Signed)
Addended by: Marcell Anger E on: 07/26/2017 01:30 PM   Modules accepted: Orders

## 2017-07-26 NOTE — Patient Instructions (Signed)
A few things to remember from today's visit:   Mild asthma with exacerbation, unspecified whether persistent - Plan: albuterol (PROAIR HFA) 108 (90 Base) MCG/ACT inhaler, predniSONE (DELTASONE) 20 MG tablet, DG Chest 2 View  viral infections are self-limited and we treat each symptom depending of severity.  Over the counter medications as decongestants and cold medications usually help, they need to be taken with caution if there is a history of high blood pressure or palpitations. Tylenol and/or Ibuprofen also helps with most symptoms (headache, muscle aching, fever,etc) Plenty of fluids. Honey helps with cough. Steam inhalations helps with runny nose, nasal congestion, and may prevent sinus infections. Cough and nasal congestion could last a few days and sometimes weeks. Please follow in not any better in 1-2 weeks or if symptoms get worse.   Albuterol inh 2 puff every 6 hours for a week then as needed for wheezing or shortness of breath.   Please be sure medication list is accurate. If a new problem present, please set up appointment sooner than planned today.

## 2017-08-01 ENCOUNTER — Ambulatory Visit
Admission: RE | Admit: 2017-08-01 | Discharge: 2017-08-01 | Disposition: A | Discharge status: Home / Self Care | Payer: Managed Care, Other (non HMO) | Source: Ambulatory Visit | Attending: Family Medicine | Admitting: Family Medicine

## 2017-08-01 DIAGNOSIS — Z1239 Encounter for other screening for malignant neoplasm of breast: Secondary | ICD-10-CM

## 2017-11-23 DIAGNOSIS — M722 Plantar fascial fibromatosis: Secondary | ICD-10-CM | POA: Diagnosis not present

## 2017-11-23 DIAGNOSIS — M7731 Calcaneal spur, right foot: Secondary | ICD-10-CM | POA: Diagnosis not present

## 2017-11-23 DIAGNOSIS — M7671 Peroneal tendinitis, right leg: Secondary | ICD-10-CM | POA: Diagnosis not present

## 2017-11-23 DIAGNOSIS — M76822 Posterior tibial tendinitis, left leg: Secondary | ICD-10-CM | POA: Diagnosis not present

## 2017-11-23 DIAGNOSIS — M71572 Other bursitis, not elsewhere classified, left ankle and foot: Secondary | ICD-10-CM | POA: Diagnosis not present

## 2017-11-23 DIAGNOSIS — M76821 Posterior tibial tendinitis, right leg: Secondary | ICD-10-CM | POA: Diagnosis not present

## 2017-11-23 DIAGNOSIS — M71571 Other bursitis, not elsewhere classified, right ankle and foot: Secondary | ICD-10-CM | POA: Diagnosis not present

## 2017-11-23 DIAGNOSIS — M7732 Calcaneal spur, left foot: Secondary | ICD-10-CM | POA: Diagnosis not present

## 2017-11-30 DIAGNOSIS — M71571 Other bursitis, not elsewhere classified, right ankle and foot: Secondary | ICD-10-CM | POA: Diagnosis not present

## 2017-11-30 DIAGNOSIS — M71572 Other bursitis, not elsewhere classified, left ankle and foot: Secondary | ICD-10-CM | POA: Diagnosis not present

## 2017-11-30 DIAGNOSIS — M722 Plantar fascial fibromatosis: Secondary | ICD-10-CM | POA: Diagnosis not present

## 2018-01-26 ENCOUNTER — Ambulatory Visit: Payer: Self-pay | Admitting: Family Medicine

## 2018-01-26 ENCOUNTER — Encounter: Payer: Self-pay | Admitting: Family Medicine

## 2018-01-26 ENCOUNTER — Ambulatory Visit: Payer: BLUE CROSS/BLUE SHIELD | Admitting: Family Medicine

## 2018-01-26 VITALS — BP 144/88 | HR 107 | Temp 99.8°F | Ht 60.0 in | Wt 165.0 lb

## 2018-01-26 DIAGNOSIS — N390 Urinary tract infection, site not specified: Secondary | ICD-10-CM

## 2018-01-26 DIAGNOSIS — R300 Dysuria: Secondary | ICD-10-CM | POA: Diagnosis not present

## 2018-01-26 DIAGNOSIS — Z0289 Encounter for other administrative examinations: Secondary | ICD-10-CM

## 2018-01-26 LAB — POCT URINALYSIS DIPSTICK
Bilirubin, UA: NEGATIVE
GLUCOSE UA: NEGATIVE
Ketones, UA: NEGATIVE
Nitrite, UA: NEGATIVE
SPEC GRAV UA: 1.01 (ref 1.010–1.025)
Urobilinogen, UA: 0.2 E.U./dL
pH, UA: 6.5 (ref 5.0–8.0)

## 2018-01-26 MED ORDER — CIPROFLOXACIN HCL 500 MG PO TABS: 500.0000 mg | ORAL_TABLET | Freq: Two times a day (BID) | ORAL | 0 refills | Status: DC

## 2018-01-26 NOTE — Progress Notes (Signed)
   Subjective:    Patient ID: Karen HollingsheadMaria Eugenia Mazo-Im, female    DOB: 17-Aug-1961, 57 y.o.   MRN: [id number]  HPI Here for 4 days of urinary burning and urgency. She drinks plenty of water.    Review of Systems  Constitutional: Positive for fever.  Respiratory: Negative.   Cardiovascular: Negative.   Gastrointestinal: Negative.   Genitourinary: Positive for dysuria, frequency and urgency.       Objective:   Physical Exam  Constitutional: She appears well-developed and well-nourished.  Cardiovascular: Normal rate, regular rhythm, normal heart sounds and intact distal pulses.  Pulmonary/Chest: Effort normal and breath sounds normal. No respiratory distress. She has no wheezes. She has no rales.  Abdominal: Soft. Bowel sounds are normal. She exhibits no distension and no mass. There is no tenderness. There is no rebound and no guarding.          Assessment & Plan:  UTI, treat with Cipro. Culture the sample.  Gershon CraneStephen Cortlynn Hollinsworth, MD

## 2018-01-29 LAB — URINE CULTURE
MICRO NUMBER: 90453666
SPECIMEN QUALITY: ADEQUATE

## 2018-05-16 ENCOUNTER — Encounter: Payer: Self-pay | Admitting: *Deleted

## 2018-05-16 ENCOUNTER — Ambulatory Visit: Payer: BLUE CROSS/BLUE SHIELD | Admitting: Family Medicine

## 2018-05-16 ENCOUNTER — Encounter: Payer: Self-pay | Admitting: Family Medicine

## 2018-05-16 VITALS — BP 126/86 | HR 88 | Temp 98.6°F | Resp 12 | Ht 60.0 in | Wt 173.1 lb

## 2018-05-16 DIAGNOSIS — M7062 Trochanteric bursitis, left hip: Secondary | ICD-10-CM

## 2018-05-16 MED ORDER — TRIAMCINOLONE ACETONIDE 40 MG/ML IJ SUSP
40.0000 mg | Freq: Once | INTRAMUSCULAR | Status: AC
  Administered 2018-05-16: 40 mg via INTRA_ARTICULAR

## 2018-05-16 NOTE — Progress Notes (Signed)
ACUTE VISIT   HPI:  Chief Complaint  Patient presents with  . Pain in left hip    swollen, started Saturday    Karen Ellis is a [age] y.o. female, who is here today complaining of left hip pain and local edema that is started about 4 days ago. Gradual onset and getting worse. She has not noted erythema, numbness, tingling, or local rash. She took ibuprofen today around 8:30 AM, which helped some.  She has not had any trauma or unusual physical activity. Pain is sharp, intermittent,today has been constant ,now it is 9/10. Alleviated by rest and exacerbated by prolonged walking or lying on left side in bed.  According to patient, about a year ago she had similar pain, bilateral, and diagnosed with bursitis.  She remembers taking oral medication, pain resolved. She denies associated fever, chills, chest pain, palpitation, dyspnea, abdominal pain, back pain, vomiting, or urinary symptoms. She has had some mild nausea, she is not sure if it is associated to hip pain.   Review of Systems  Constitutional: Negative for chills and fever.  Respiratory: Negative for shortness of breath and wheezing.   Cardiovascular: Negative for chest pain, palpitations and leg swelling.  Gastrointestinal: Positive for nausea. Negative for abdominal pain and vomiting.  Genitourinary: Negative for dysuria and hematuria.  Musculoskeletal: Positive for arthralgias, gait problem and joint swelling. Negative for back pain.  Skin: Negative for color change and pallor.  Neurological: Negative for weakness and numbness.  Psychiatric/Behavioral: Positive for sleep disturbance. Negative for confusion. The patient is nervous/anxious.       Current Outpatient Medications on File Prior to Visit  Medication Sig Dispense Refill  . albuterol (PROAIR HFA) 108 (90 Base) MCG/ACT inhaler Inhale 2 puffs into the lungs every 6 (six) hours as needed for wheezing or shortness of breath. 6.7 g 1  .  aspirin EC 81 MG tablet Take 81 mg by mouth daily.    . cholecalciferol (VITAMIN D) 1000 UNITS tablet Take 1,000 Units by mouth daily.    . fish oil-omega-3 fatty acids 1000 MG capsule Take 1 g by mouth daily.    Marland Kitchen. loratadine (CLARITIN) 10 MG tablet Take 10 mg by mouth as needed for allergies.     No current facility-administered medications on file prior to visit.      Past Medical History:  Diagnosis Date  . Allergy   . Asthma    Allergies  Allergen Reactions  . Morphine And Related   . Latex Rash    Social History   Socioeconomic History  . Marital status: Married    Spouse name: Not on file  . Number of children: Not on file  . Years of education: Not on file  . Highest education level: Not on file  Occupational History  . Not on file  Social Needs  . Financial resource strain: Not on file  . Food insecurity:    Worry: Not on file    Inability: Not on file  . Transportation needs:    Medical: Not on file    Non-medical: Not on file  Tobacco Use  . Smoking status: Never Smoker  . Smokeless tobacco: Never Used  Substance and Sexual Activity  . Alcohol use: Yes    Comment: occasionally  . Drug use: No  . Sexual activity: Not on file  Lifestyle  . Physical activity:    Days per week: Not on file    Minutes per session: Not on  file  . Stress: Not on file  Relationships  . Social connections:    Talks on phone: Not on file    Gets together: Not on file    Attends religious service: Not on file    Active member of club or organization: Not on file    Attends meetings of clubs or organizations: Not on file    Relationship status: Not on file  Other Topics Concern  . Not on file  Social History Narrative  . Not on file    Vitals:   05/16/18 1515  BP: 126/86  Pulse: 88  Resp: 12  Temp: 98.6 F (37 C)  SpO2: 97%   Body mass index is 33.81 kg/m.   Physical Exam  Nursing note and vitals reviewed. Constitutional: She is oriented to person, place,  and time. She appears well-developed. She does not appear ill. No distress.  HENT:  Head: Normocephalic and atraumatic.  Eyes: Conjunctivae are normal.  Cardiovascular: Normal rate and regular rhythm.  No murmur heard. Pulses:      Dorsalis pedis pulses are 2+ on the left side.  Respiratory: Effort normal and breath sounds normal. No respiratory distress.  GI: Soft. She exhibits no mass. There is no tenderness.  Musculoskeletal: She exhibits no edema.       Left hip: She exhibits tenderness. She exhibits normal range of motion, normal strength and no swelling.  Tenderness upon palpation of left great trochanteric area, no edema or erythema appreciated. Pain is also elicited with abduction, hip flexion, and external rotation. Antalgic gait.  Neurological: She is alert and oriented to person, place, and time. She has normal strength. Coordination normal.  Skin: Skin is warm. No rash noted. No erythema.  Psychiatric: She has a normal mood and affect. Her speech is normal.  Well groomed, good eye contact.    ASSESSMENT AND PLAN:   Karen Ellis was seen today for pain in left hip.  Diagnoses and all orders for this visit:  Trochanteric bursitis of left hip -     triamcinolone acetonide (KENALOG-40) injection 40 mg   After discussion of risk vs benefits she gives verbal consent to proceed with left trochanteric bursa injection.  After palpating area in a sterile fashion, trochanteric bursa was injected with Kenalog 40 mg and plain lidocaine 2 cc. She tolerated  procedure well. 10 to 15 minutes after injection she is reporting great improvement of pain, she is no longer limping. Post procedure instructions given. Recommend applying local ice and relative rest for 2 to 3 days. If she is still having any pain into 3 weeks she was instructed to let me know, in which case we need to consider referral to sport medicine/orthopedist.  Return if symptoms worsen or fail to improve.   Karen G.  [place], MD  [facility]. [place] office.

## 2018-05-16 NOTE — Patient Instructions (Signed)
A few things to remember from today's visit:   Trochanteric bursitis of left hip  Trochanteric Bursitis Trochanteric bursitis is a condition that causes hip pain. Trochanteric bursitis happens when fluid-filled sacs (bursae) in the hip get irritated. Normally these sacs absorb shock and help strong bands of tissue (tendons) in your hip glide smoothly over each other and over your hip bones. What are the causes? This condition results from increased friction between the hip bones and the tendons that go over them. This condition can happen if you:  Have weak hips.  Use your hip muscles too much (overuse).  Get hit in the hip.  What increases the risk? This condition is more likely to develop in:  Women.  Adults who are middle-aged or older.  People with arthritis or a spinal condition.  People with weak buttocks muscles (gluteal muscles).  People who have one leg that is shorter than the other.  People who participate in certain kinds of athletic activities, such as: ? Running sports, especially long-distance running. ? Contact sports, like football or martial arts. ? Sports in which falls may occur, like skiing.  What are the signs or symptoms? The main symptom of this condition is pain and tenderness over the point of your hip. The pain may be:  Sharp and intense.  Dull and achy.  Felt on the outside of your thigh.  It may increase when you:  Lie on your side.  Walk or run.  Go up on stairs.  Sit.  Stand up after sitting.  Stand for long periods of time.  How is this diagnosed? This condition may be diagnosed based on:  Your symptoms.  Your medical history.  A physical exam.  Imaging tests, such as: ? X-rays to check your bones. ? An MRI or ultrasound to check your tendons and muscles.  During your physical exam, your health care provider will check the movement and strength of your hip. He or she may press on the point of your hip to check for  pain. How is this treated? This condition may be treated by:  Resting.  Reducing your activity.  Avoiding activities that cause pain.  Using crutches, a cane, or a walker to decrease the strain on your hip.  Taking medicine to help with swelling.  Having medicine injected into the bursae to help with swelling.  Using ice, heat, and massage therapy for pain relief.  Physical therapy exercises for strength and flexibility.  Surgery (rare).  Follow these instructions at home: Activity  Rest.  Avoid activities that cause pain.  Return to your normal activities as told by your health care provider. Ask your health care provider what activities are safe for you. Managing pain, stiffness, and swelling  Take over-the-counter and prescription medicines only as told by your health care provider.  If directed, apply heat to the injured area as told by your health care provider. ? Place a towel between your skin and the heat source. ? Leave the heat on for 20-30 minutes. ? Remove the heat if your skin turns bright red. This is especially important if you are unable to feel pain, heat, or cold. You may have a greater risk of getting burned.  If directed, apply ice to the injured area: ? Put ice in a plastic bag. ? Place a towel between your skin and the bag. ? Leave the ice on for 20 minutes, 2-3 times a day. General instructions  If the affected leg is one that  you use for driving, ask your health care provider when it is safe to drive.  Use crutches, a cane, or a walker as told by your health care provider.  If one of your legs is shorter than the other, get fitted for a shoe insert.  Lose weight if you are overweight. How is this prevented?  Wear supportive footwear that is appropriate for your sport.  If you have hip pain, start any new exercise or sport slowly.  Maintain physical fitness, including: ? Strength. ? Flexibility. Contact a health care provider  if:  Your pain does not improve with 2-4 weeks. Get help right away if:  You develop severe pain.  You have a fever.  You develop increased redness over your hip.  You have a change in your bowel function or bladder function.  You cannot control the muscles in your feet. This information is not intended to replace advice given to you by your health care provider. Make sure you discuss any questions you have with your health care provider. Document Released: 11/10/2004 Document Revised: 06/08/2016 Document Reviewed: 09/18/2015 Elsevier Interactive Patient Education  [employer].  Please be sure medication list is accurate. If a new problem present, please set up appointment sooner than planned today.

## 2018-06-13 ENCOUNTER — Other Ambulatory Visit: Payer: Self-pay | Admitting: Family Medicine

## 2018-06-13 DIAGNOSIS — Z1231 Encounter for screening mammogram for malignant neoplasm of breast: Secondary | ICD-10-CM

## 2018-07-01 DIAGNOSIS — E7800 Pure hypercholesterolemia, unspecified: Secondary | ICD-10-CM | POA: Insufficient documentation

## 2018-07-01 NOTE — Progress Notes (Signed)
HPI:   Karen Ellis is a 57 y.o. female, who is here today for her routine physical.  Last CPE: 03/27/17.  Regular exercise 3 or more time per week: Not consistently. She walks a lot at work. Following a healthy diet: She is trying to avoid fried food. She is baking and eating at home. She lives with her husband.  Chronic medical problems: Vit D def,asthma,seasomal allergies,and arthralgias.  Pap smear in 07/2013 with negative HPV. Hx of abnormal pap smears: 29 years ago,s/p ColpoBx. Hx of STD's: Denies.  Immunization History  Administered Date(s) Administered  . Pneumococcal Polysaccharide-23 07/09/2012  . Tdap 05/25/2007    Mammogram: 08/01/17 Bi-Rads 1 Colonoscopy: at age 57,she is due next year. DEXA: N/A  Hep C screening: 03/2017 NR  She has no concerns today.    Review of Systems  Constitutional: Negative for appetite change, fatigue and fever.  HENT: Negative for dental problem, hearing loss, mouth sores, sore throat, trouble swallowing and voice change.   Eyes: Negative for redness and visual disturbance.  Respiratory: Negative for cough, shortness of breath and wheezing.   Cardiovascular: Negative for chest pain and leg swelling.  Gastrointestinal: Negative for abdominal pain, nausea and vomiting.       No changes in bowel habits.  Endocrine: Negative for cold intolerance, heat intolerance, polydipsia, polyphagia and polyuria.  Genitourinary: Negative for decreased urine volume, dysuria, hematuria, vaginal bleeding and vaginal discharge.  Musculoskeletal: Positive for arthralgias. Negative for gait problem and myalgias.  Skin: Negative for color change and rash.  Allergic/Immunologic: Positive for environmental allergies.  Neurological: Negative for syncope, weakness and headaches.  Hematological: Negative for adenopathy. Does not bruise/bleed easily.  Psychiatric/Behavioral: Negative for confusion and sleep disturbance. The patient is  not nervous/anxious.   All other systems reviewed and are negative.     Current Outpatient Medications on File Prior to Visit  Medication Sig Dispense Refill  . albuterol (PROAIR HFA) 108 (90 Base) MCG/ACT inhaler Inhale 2 puffs into the lungs every 6 (six) hours as needed for wheezing or shortness of breath. 6.7 g 1  . aspirin EC 81 MG tablet Take 81 mg by mouth daily.    . cholecalciferol (VITAMIN D) 1000 UNITS tablet Take 1,000 Units by mouth daily.    . fish oil-omega-3 fatty acids 1000 MG capsule Take 1 g by mouth daily.    Marland Kitchen. loratadine (CLARITIN) 10 MG tablet Take 10 mg by mouth as needed for allergies.     No current facility-administered medications on file prior to visit.      Past Medical History:  Diagnosis Date  . Allergy   . Asthma     Past Surgical History:  Procedure Laterality Date  . CESAREAN SECTION  1990  . TUBAL LIGATION  1994    Allergies  Allergen Reactions  . Morphine And Related   . Latex Rash    Family History  Problem Relation Age of Onset  . Heart disease Father 848       CAD  . Heart disease Brother        arrhythmias  . Heart disease Daughter        CAD  . Heart disease Brother 5033       CAD  . Breast cancer Neg Hx     Social History   Socioeconomic History  . Marital status: Married    Spouse name: Not on file  . Number of children: Not on file  . Years  of education: Not on file  . Highest education level: Not on file  Occupational History  . Not on file  Social Needs  . Financial resource strain: Not on file  . Food insecurity:    Worry: Not on file    Inability: Not on file  . Transportation needs:    Medical: Not on file    Non-medical: Not on file  Tobacco Use  . Smoking status: Never Smoker  . Smokeless tobacco: Never Used  Substance and Sexual Activity  . Alcohol use: Yes    Comment: occasionally  . Drug use: No  . Sexual activity: Not on file  Lifestyle  . Physical activity:    Days per week: Not on file      Minutes per session: Not on file  . Stress: Not on file  Relationships  . Social connections:    Talks on phone: Not on file    Gets together: Not on file    Attends religious service: Not on file    Active member of club or organization: Not on file    Attends meetings of clubs or organizations: Not on file    Relationship status: Not on file  Other Topics Concern  . Not on file  Social History Narrative  . Not on file     Vitals:   07/02/18 0846  BP: 122/80  Pulse: 67  Resp: 12  Temp: 98.7 F (37.1 C)  SpO2: 98%   Body mass index is 32.86 kg/m.   Wt Readings from Last 3 Encounters:  07/02/18 168 lb 4 oz (76.3 kg)  05/16/18 173 lb 2 oz (78.5 kg)  01/26/18 165 lb (74.8 kg)    Physical Exam  Nursing note and vitals reviewed. Constitutional: She is oriented to person, place, and time. She appears well-developed. No distress.  HENT:  Head: Normocephalic and atraumatic.  Right Ear: Hearing, tympanic membrane, external ear and ear canal normal.  Left Ear: Hearing, tympanic membrane, external ear and ear canal normal.  Mouth/Throat: Uvula is midline, oropharynx is clear and moist and mucous membranes are normal.  Eyes: Pupils are equal, round, and reactive to light. Conjunctivae and EOM are normal.  Neck: No tracheal deviation present. No thyromegaly present.  Cardiovascular: Normal rate and regular rhythm.  No murmur heard. Pulses:      Dorsalis pedis pulses are 2+ on the right side, and 2+ on the left side.  Respiratory: Effort normal and breath sounds normal. No respiratory distress.  GI: Soft. She exhibits no mass. There is no hepatomegaly. There is no tenderness.  Genitourinary: There is no rash, tenderness or lesion on the right labia. There is no rash, tenderness or lesion on the left labia. Uterus is not enlarged and not tender. Cervix exhibits no motion tenderness, no discharge and no friability. Right adnexum displays no mass, no tenderness and no fullness.  Left adnexum displays no mass, no tenderness and no fullness. No erythema, tenderness or bleeding in the vagina. No vaginal discharge found.  Genitourinary Comments: Breast: No masses, nipple discharge, or skin abnormalities appreciated bilateral.  Pap smear collected.  Musculoskeletal: She exhibits no edema.  No major deformity or signs of synovitis appreciated.  Lymphadenopathy:    She has no cervical adenopathy.       Right: No inguinal and no supraclavicular adenopathy present.       Left: No inguinal and no supraclavicular adenopathy present.  Neurological: She is alert and oriented to person, place, and time. She has  normal strength. No cranial nerve deficit. Coordination and gait normal.  Reflex Scores:      Bicep reflexes are 2+ on the right side and 2+ on the left side.      Patellar reflexes are 2+ on the right side and 2+ on the left side. Skin: Skin is warm. No rash noted. No erythema.  Psychiatric: She has a normal mood and affect. Cognition and memory are normal.  Well groomed, good eye contact.    ASSESSMENT AND PLAN:  Karen Ellis was here today annual physical examination.   Orders Placed This Encounter  Procedures  . Basic metabolic panel  . Lipid panel  . Hemoglobin A1c  . VITAMIN D 25 Hydroxy (Vit-D Deficiency, Fractures)    The 10-year ASCVD risk score Denman George DC Jr., et al., 2013) is: 2.4%   Values used to calculate the score:     Age: 57 years     Sex: Female     Is Non-Hispanic African American: No     Diabetic: No     Tobacco smoker: No     Systolic Blood Pressure: 122 mmHg     Is BP treated: No     HDL Cholesterol: 69.9 mg/dL     Total Cholesterol: 209 mg/dL   Routine general medical examination at a health care facility  We discussed the importance of regular physical activity and healthy diet for prevention of chronic illness and/or complications. Preventive guidelines reviewed. Vaccination updated today.  She receives  influenza vaccine through work.  Ca++ and vit D supplementation to continue. Next CPE in a year.  Diabetes mellitus screening -     Basic metabolic panel -     Hemoglobin A1c  Cervical cancer screening -     Cytology - PAP ([place])   Hypercholesterolemia She will continue nonpharmacologic treatment, low-fat diet. Further recommendations will be given according to results as well as 10 years ASCVD score.  Vitamin D deficiency Continue vitamin D 1000 units daily. Further recommendations will be given according to lab results.    Return in 1 year (on 07/03/2019).     Daviel Allegretto G. [place], MD  [facility]. [place] office.

## 2018-07-02 ENCOUNTER — Ambulatory Visit (INDEPENDENT_AMBULATORY_CARE_PROVIDER_SITE_OTHER): Payer: BLUE CROSS/BLUE SHIELD | Admitting: Family Medicine

## 2018-07-02 ENCOUNTER — Encounter: Payer: Self-pay | Admitting: Family Medicine

## 2018-07-02 ENCOUNTER — Other Ambulatory Visit (HOSPITAL_COMMUNITY)
Admission: RE | Admit: 2018-07-02 | Discharge: 2018-07-02 | Disposition: A | Discharge status: Home / Self Care | Payer: BLUE CROSS/BLUE SHIELD | Source: Ambulatory Visit | Attending: Family Medicine | Admitting: Family Medicine

## 2018-07-02 VITALS — BP 122/80 | HR 67 | Temp 98.7°F | Resp 12 | Ht 60.0 in | Wt 168.2 lb

## 2018-07-02 DIAGNOSIS — Z0000 Encounter for general adult medical examination without abnormal findings: Secondary | ICD-10-CM

## 2018-07-02 DIAGNOSIS — Z124 Encounter for screening for malignant neoplasm of cervix: Secondary | ICD-10-CM | POA: Insufficient documentation

## 2018-07-02 DIAGNOSIS — Z23 Encounter for immunization: Secondary | ICD-10-CM

## 2018-07-02 DIAGNOSIS — E559 Vitamin D deficiency, unspecified: Secondary | ICD-10-CM

## 2018-07-02 DIAGNOSIS — E7800 Pure hypercholesterolemia, unspecified: Secondary | ICD-10-CM

## 2018-07-02 DIAGNOSIS — Z131 Encounter for screening for diabetes mellitus: Secondary | ICD-10-CM

## 2018-07-02 LAB — VITAMIN D 25 HYDROXY (VIT D DEFICIENCY, FRACTURES): VITD: 33.3 ng/mL (ref 30.00–100.00)

## 2018-07-02 LAB — BASIC METABOLIC PANEL
BUN: 16 mg/dL (ref 6–23)
CHLORIDE: 103 meq/L (ref 96–112)
CO2: 28 mEq/L (ref 19–32)
Calcium: 9.5 mg/dL (ref 8.4–10.5)
Creatinine, Ser: 0.69 mg/dL (ref 0.40–1.20)
GFR: 92.52 mL/min (ref >60.00)
Glucose, Bld: 84 mg/dL (ref 70–99)
POTASSIUM: 4.2 meq/L (ref 3.5–5.1)
Sodium: 138 mEq/L (ref 135–145)

## 2018-07-02 LAB — LIPID PANEL
CHOL/HDL RATIO: 3
CHOLESTEROL: 209 mg/dL — AB (ref 0–200)
HDL: 69.9 mg/dL (ref >39.00)
LDL CALC: 123 mg/dL — AB (ref 0–99)
NonHDL: 138.97
Triglycerides: 80 mg/dL (ref 0.0–149.0)
VLDL: 16 mg/dL (ref 0.0–40.0)

## 2018-07-02 LAB — HEMOGLOBIN A1C: Hgb A1c MFr Bld: 5.7 % (ref 4.6–6.5)

## 2018-07-02 NOTE — Patient Instructions (Addendum)
A few things to remember from today's visit:   Routine general medical examination at a health care facility  Hypercholesterolemia - Plan: Lipid panel  Diabetes mellitus screening - Plan: Basic metabolic panel, Hemoglobin A1c  Cervical cancer screening - Plan: Cytology - PAP ([place])  Vitamin D deficiency - Plan: VITAMIN D 25 Hydroxy (Vit-D Deficiency, Fractures)  Today you have you routine preventive visit.  At least 150 minutes of moderate exercise per week, daily brisk walking for 15-30 min is a good exercise option. Healthy diet low in saturated (animal) fats and sweets and consisting of fresh fruits and vegetables, lean meats such as fish and white chicken and whole grains.  These are some of recommendations for screening depending of age and risk factors:   - Vaccines:  Tdap vaccine every 10 years.Given today.  Shingles vaccine recommended at age 59, could be given after 59 years of age but not sure about insurance coverage.   Pneumonia vaccines:  Prevnar 13 at 65 and Pneumovax at 66. Sometimes Pneumovax is giving earlier if history of smoking, lung disease,diabetes,kidney disease among some.    Screening for diabetes at age 59 and every 3 years.  Cervical cancer prevention:  Pap smear every 3-5 years between women 30 and older if pap smear negative and HPV screening negative. Done today.  -Breast cancer: Mammogram: There is disagreement between experts about when to start screening in low risk asymptomatic female but recent recommendations are to start screening at 5740 and not later than 59 years old , every 1-2 years and after 59 yo q 2 years. Screening is recommended until 59 years old but some women can continue screening depending of healthy issues.   Colon cancer screening: starts at 59 years old until 59 years old.  Cholesterol disorder screening at age 59 and every 3 years.  Also recommended:  1. Dental visit- Brush and floss your teeth twice daily;  visit your dentist twice a year. 2. Eye doctor- Get an eye exam at least every 2 years. 3. Helmet use- Always wear a helmet when riding a bicycle, motorcycle, rollerblading or skateboarding. 4. Safe sex- If you may be exposed to sexually transmitted infections, use a condom. 5. Seat belts- Seat belts can save your live; always wear one. 6. Smoke/Carbon Monoxide detectors- These detectors need to be installed on the appropriate level of your home. Replace batteries at least once a year. 7. Skin cancer- When out in the sun please cover up and use sunscreen 15 SPF or higher. 8. Violence- If anyone is threatening or hurting you, please tell your healthcare provider.  9. Drink alcohol in moderation- Limit alcohol intake to one drink or less per day. Never drink and drive.  Please be sure medication list is accurate. If a new problem present, please set up appointment sooner than planned today.

## 2018-07-02 NOTE — Assessment & Plan Note (Signed)
She will continue nonpharmacologic treatment, low-fat diet. Further recommendations will be given according to results as well as 10 years ASCVD score.

## 2018-07-02 NOTE — Assessment & Plan Note (Signed)
Continue vitamin D 1000 units daily. Further recommendations will be given according to lab results.

## 2018-07-03 LAB — CYTOLOGY - PAP
DIAGNOSIS: NEGATIVE
HPV: NOT DETECTED

## 2018-07-04 ENCOUNTER — Encounter: Payer: Self-pay | Admitting: Family Medicine

## 2018-08-03 ENCOUNTER — Ambulatory Visit: Payer: BLUE CROSS/BLUE SHIELD

## 2018-09-03 ENCOUNTER — Ambulatory Visit
Admission: RE | Admit: 2018-09-03 | Discharge: 2018-09-03 | Disposition: A | Discharge status: Home / Self Care | Payer: BLUE CROSS/BLUE SHIELD | Source: Ambulatory Visit | Attending: Family Medicine | Admitting: Family Medicine

## 2018-09-03 ENCOUNTER — Ambulatory Visit: Payer: BLUE CROSS/BLUE SHIELD

## 2018-09-03 DIAGNOSIS — Z1231 Encounter for screening mammogram for malignant neoplasm of breast: Secondary | ICD-10-CM

## 2018-12-19 ENCOUNTER — Ambulatory Visit: Payer: BLUE CROSS/BLUE SHIELD | Admitting: Family Medicine

## 2018-12-19 ENCOUNTER — Encounter: Payer: Self-pay | Admitting: Family Medicine

## 2018-12-19 VITALS — BP 124/83 | HR 99 | Temp 98.7°F | Resp 12 | Ht 60.0 in | Wt 167.2 lb

## 2018-12-19 DIAGNOSIS — M7062 Trochanteric bursitis, left hip: Secondary | ICD-10-CM

## 2018-12-19 DIAGNOSIS — M25552 Pain in left hip: Secondary | ICD-10-CM

## 2018-12-19 MED ORDER — TRIAMCINOLONE ACETONIDE 40 MG/ML IJ SUSP
40.0000 mg | Freq: Once | INTRAMUSCULAR | Status: AC
  Administered 2018-12-19: 40 mg via INTRAMUSCULAR

## 2018-12-19 NOTE — Progress Notes (Addendum)
ACUTE VISIT   HPI:  Chief Complaint  Patient presents with  . Leg Pain    left leg pain that radiates from hip to foot    Karen Ellis is a 58 y.o. female, who is here today complaining of 1 to 2 weeks of left hip pain. Started gradual, lateral aspect, radiated down to ankle. Sharp intermittent pain, 8/10. It seems to be getting worse.  She denies lower back pain, numbness, tingling, burning, saddle anesthesia, or bowel/urine incontinence. No history of trauma or unusual physical activity. During the day pain is mild, it does not not interfere with normal daily activities. Pain is worse at night, mainly when she sleeps on her left side.  Alleviated by changing position or standing up.  She has not noted local skin changes, erythema, or erythema. She has taken ibuprofen, it does not help.  Review of Systems  Constitutional: Negative for chills, fatigue and fever.  Cardiovascular: Negative for leg swelling.  Gastrointestinal: Negative for abdominal pain, nausea and vomiting.  Musculoskeletal: Positive for arthralgias. Negative for back pain and joint swelling.  Skin: Negative for rash.  Neurological: Negative for weakness and numbness.    Current Outpatient Medications on File Prior to Visit  Medication Sig Dispense Refill  . albuterol (PROAIR HFA) 108 (90 Base) MCG/ACT inhaler Inhale 2 puffs into the lungs every 6 (six) hours as needed for wheezing or shortness of breath. 6.7 g 1  . aspirin EC 81 MG tablet Take 81 mg by mouth daily.    . cholecalciferol (VITAMIN D) 1000 UNITS tablet Take 1,000 Units by mouth daily.    . fish oil-omega-3 fatty acids 1000 MG capsule Take 1 g by mouth daily.    Marland Kitchen loratadine (CLARITIN) 10 MG tablet Take 10 mg by mouth as needed for allergies.     No current facility-administered medications on file prior to visit.      Past Medical History:  Diagnosis Date  . Allergy   . Asthma    Allergies  Allergen Reactions   . Morphine And Related   . Latex Rash    Social History   Socioeconomic History  . Marital status: Married    Spouse name: Not on file  . Number of children: Not on file  . Years of education: Not on file  . Highest education level: Not on file  Occupational History  . Not on file  Social Needs  . Financial resource strain: Not on file  . Food insecurity:    Worry: Not on file    Inability: Not on file  . Transportation needs:    Medical: Not on file    Non-medical: Not on file  Tobacco Use  . Smoking status: Never Smoker  . Smokeless tobacco: Never Used  Substance and Sexual Activity  . Alcohol use: Yes    Comment: occasionally  . Drug use: No  . Sexual activity: Not on file  Lifestyle  . Physical activity:    Days per week: Not on file    Minutes per session: Not on file  . Stress: Not on file  Relationships  . Social connections:    Talks on phone: Not on file    Gets together: Not on file    Attends religious service: Not on file    Active member of club or organization: Not on file    Attends meetings of clubs or organizations: Not on file    Relationship status: Not on file  Other Topics Concern  . Not on file  Social History Narrative  . Not on file    Vitals:   12/19/18 1526  BP: 124/83  Pulse: 99  Resp: 12  Temp: 98.7 F (37.1 C)  SpO2: 98%   Body mass index is 32.66 kg/m.  Physical Exam  Nursing note and vitals reviewed. Constitutional: She is oriented to person, place, and time. She appears well-developed and well-nourished. No distress.  HENT:  Head: Normocephalic and atraumatic.  Eyes: Conjunctivae are normal.  Cardiovascular: Normal rate and regular rhythm.  Pulses:      Dorsalis pedis pulses are 2+ on the left side.       Posterior tibial pulses are 2+ on the left side.  Respiratory: Effort normal. No respiratory distress.  Musculoskeletal:        General: Tenderness present. No edema.     Left hip: She exhibits tenderness.  She exhibits normal range of motion, normal strength and no swelling.     Left knee: She exhibits normal range of motion. No tenderness found.       Legs:     Comments: Tenderness upon palpation of lateral aspect of left hip, major trochanteric bursa. No edema or erythema on affected area.  Neurological: She is alert and oriented to person, place, and time. She has normal strength. Gait normal.  Psychiatric: Her mood appears anxious.  Well-groomed, good eye contact.      ASSESSMENT AND PLAN:  Karen Ellis was seen today for leg pain.  Diagnoses and all orders for this visit:  Acute hip pain, left We discussed possible causes of hip pain,including OA,radicular pain,and bursitis. I do not think imaging is needed at this time. If pain is persistent we will consider orthopedic referral.   -     triamcinolone acetonide (KENALOG-40) injection 40 mg  Trochanteric bursitis of left hip We discussed treatment options. After discussing risks of procedure, she wishes to proceed.  After cleaning area with alcohol swabs x 4, Kenalog 40 mg + plain Lidocaine 1% 1.5 cc injected in left trochanteric bursa.  She tolerated procedure well. Post procedure instructions given. 10-15 min after injection she reports pain 4/10, 50% improvement. Instructed about warning signs. F/U as needed.      Return if symptoms worsen or fail to improve.     Calix Heinbaugh G. [place], MD  [facility]. [place] office.

## 2018-12-19 NOTE — Patient Instructions (Signed)
A few things to remember from today's visit:   Acute hip pain, left - Plan: triamcinolone acetonide (KENALOG-40) injection 40 mg  Take ibuprofen PM for 3 nights. Local ice.  If pain is not greatly improved in 3 days or resolved in a couple weeks please let me know, orthopedist evaluation may need to be considered.  Please be sure medication list is accurate. If a new problem present, please set up appointment sooner than planned today.

## 2019-01-07 ENCOUNTER — Other Ambulatory Visit: Payer: Self-pay | Admitting: Family Medicine

## 2019-01-07 DIAGNOSIS — J45901 Unspecified asthma with (acute) exacerbation: Secondary | ICD-10-CM

## 2019-02-25 ENCOUNTER — Encounter: Payer: Self-pay | Admitting: Family Medicine

## 2019-02-25 ENCOUNTER — Ambulatory Visit (INDEPENDENT_AMBULATORY_CARE_PROVIDER_SITE_OTHER): Payer: BLUE CROSS/BLUE SHIELD | Admitting: Family Medicine

## 2019-02-25 ENCOUNTER — Other Ambulatory Visit: Payer: Self-pay

## 2019-02-25 DIAGNOSIS — R21 Rash and other nonspecific skin eruption: Secondary | ICD-10-CM

## 2019-02-25 MED ORDER — TRIAMCINOLONE ACETONIDE 0.1 % EX CREA: 1.0000 "application " | TOPICAL_CREAM | Freq: Two times a day (BID) | CUTANEOUS | 0 refills | Status: AC

## 2019-02-25 NOTE — Progress Notes (Signed)
Virtual Visit via Video Note   I connected with Karen Ellis on 02/25/19 at  4:30 PM EDT by a video enabled telemedicine application and verified that I am speaking with the correct person using two identifiers.  Location patient: home Location provider:work or home office Persons participating in the virtual visit: patient, provider  I discussed the limitations of evaluation and management by telemedicine and the availability of in person appointments.She expressed understanding and agreed to proceed.   HPI: She is a 58 yo female with Hx of asthma and allergies,concerned about non pruritic nor tender erythematous rash on arms and abdomen. She noted rash today am when she was changing clothes. She did not have rash last night. She has not noted exacerbating or alleviating factors. Problem is constant,has not changed since it was noted. It does not compromised palms or soles.   She denies any new medication, detergent, soap, or body product. No known insect bite or outdoor exposures to plants. No sick contact.She works in a nursing home. No Hx of eczema or similar rash in the past.  OTC medication for this problem: Benadryl tab   She has not notedoral lesions/edema,cough, wheezing, dyspnea, abdominal pain, nausea, or vomiting. Negative for recent URI.  ROS: See pertinent positives and negatives per HPI. COVID-19 screening questions: Denies new fever,cough,sore throat,or possible exposure to COVID-19. Negative for loss in the sense of smell or taste.  Past Medical History:  Diagnosis Date  . Allergy   . Asthma     Past Surgical History:  Procedure Laterality Date  . CESAREAN SECTION  1990  . TUBAL LIGATION  1994    Family History  Problem Relation Age of Onset  . Heart disease Father 58       CAD  . Heart disease Brother        arrhythmias  . Heart disease Daughter        CAD  . Heart disease Brother 45       CAD  . Breast cancer Neg Hx     Social History    Socioeconomic History  . Marital status: Married    Spouse name: Not on file  . Number of children: Not on file  . Years of education: Not on file  . Highest education level: Not on file  Occupational History  . Not on file  Social Needs  . Financial resource strain: Not on file  . Food insecurity:    Worry: Not on file    Inability: Not on file  . Transportation needs:    Medical: Not on file    Non-medical: Not on file  Tobacco Use  . Smoking status: Never Smoker  . Smokeless tobacco: Never Used  Substance and Sexual Activity  . Alcohol use: Yes    Comment: occasionally  . Drug use: No  . Sexual activity: Not on file  Lifestyle  . Physical activity:    Days per week: Not on file    Minutes per session: Not on file  . Stress: Not on file  Relationships  . Social connections:    Talks on phone: Not on file    Gets together: Not on file    Attends religious service: Not on file    Active member of club or organization: Not on file    Attends meetings of clubs or organizations: Not on file    Relationship status: Not on file  . Intimate partner violence:    Fear of current or ex partner:  Not on file    Emotionally abused: Not on file    Physically abused: Not on file    Forced sexual activity: Not on file  Other Topics Concern  . Not on file  Social History Narrative  . Not on file     Current Outpatient Medications:  .  aspirin EC 81 MG tablet, Take 81 mg by mouth daily., Disp: , Rfl:  .  cholecalciferol (VITAMIN D) 1000 UNITS tablet, Take 1,000 Units by mouth daily., Disp: , Rfl:  .  fish oil-omega-3 fatty acids 1000 MG capsule, Take 1 g by mouth daily., Disp: , Rfl:  .  loratadine (CLARITIN) 10 MG tablet, Take 10 mg by mouth as needed for allergies., Disp: , Rfl:  .  PROAIR HFA 108 (90 Base) MCG/ACT inhaler, TAKE 2 PUFFS BY MOUTH EVERY 6 HOURS AS NEEDED FOR WHEEZE OR SHORTNESS OF BREATH, Disp: 8.5 Inhaler, Rfl: 1 .  triamcinolone cream (KENALOG) 0.1 %,  Apply 1 application topically 2 (two) times daily for 14 days., Disp: 45 g, Rfl: 0  EXAM:  VITALS per patient if applicable:N/A  GENERAL: alert, oriented, appears well and in no acute distress  HEENT: atraumatic, conjunctiva clear, no obvious facial abnormalities on inspection.  NECK: normal movements of the head and neck  LUNGS: on inspection no signs of respiratory distress, breathing rate appears normal, no obvious gross SOB, gasping or wheezing  CV: no obvious cyanosis  Karen: moves all visible extremities without noticeable abnormality. No signs of synovitis.  Skin: erythematous,confluent rash affecting extensor surfaces,proximal aspect of arms and around umbilicus. I do not appreciate edema on affected areas.  PSYCH/NEURO: pleasant and cooperative, no obvious depression or anxiety, speech and thought processing grossly intact  ASSESSMENT AND PLAN:  Discussed the following assessment and plan:  Erythematous rash  We discussed possible etiologies, including fungal, contact dermatitis, eczema, and insect bites among some. Even though there is no Hx of tick bite, given the fact of sudden onset and not associated with pruritus or tenderness, I recommended Lyme disease testing.  Instructed about warning signs.   25 min face to face OV. > 50% was dedicated to discussion of differential Dx, prognosis, and treatment options.  I discussed the assessment and treatment plan with the patient. She was provided an opportunity to ask questions and all were answered. The patient agreed with the plan and demonstrated an understanding of the instructions.   The patient was advised to call back or seek an in-person evaluation if the symptoms worsen or if the condition fails to improve as anticipated.  Return if symptoms worsen or fail to improve.    Lakota Schweppe [place], MD

## 2019-02-26 ENCOUNTER — Encounter: Payer: Self-pay | Admitting: *Deleted

## 2019-02-28 ENCOUNTER — Telehealth: Payer: Self-pay | Admitting: *Deleted

## 2019-02-28 NOTE — Telephone Encounter (Signed)
Noted. FYI sent to Dr. Jordan. 

## 2019-02-28 NOTE — Telephone Encounter (Signed)
Copied from CRM [id number]. Topic: General - Other >> Feb 27, 2019  3:48 PM Laural Benes, [place] C wrote: Reason for CRM: pt called in to let provider know that her rash has cleared up and that the medication helped.

## 2019-03-05 ENCOUNTER — Other Ambulatory Visit: Payer: Self-pay | Admitting: Family Medicine

## 2019-03-05 DIAGNOSIS — J45901 Unspecified asthma with (acute) exacerbation: Secondary | ICD-10-CM

## 2019-03-13 ENCOUNTER — Other Ambulatory Visit: Payer: Self-pay | Admitting: *Deleted

## 2019-03-13 DIAGNOSIS — J45901 Unspecified asthma with (acute) exacerbation: Secondary | ICD-10-CM

## 2019-03-13 MED ORDER — ALBUTEROL SULFATE HFA 108 (90 BASE) MCG/ACT IN AERS: INHALATION_SPRAY | RESPIRATORY_TRACT | 1 refills | Status: DC

## 2019-06-11 ENCOUNTER — Ambulatory Visit: Payer: BC Managed Care – PPO | Admitting: Family Medicine

## 2019-06-11 ENCOUNTER — Encounter: Payer: Self-pay | Admitting: Family Medicine

## 2019-06-20 DIAGNOSIS — M25511 Pain in right shoulder: Secondary | ICD-10-CM | POA: Diagnosis not present

## 2019-06-25 DIAGNOSIS — Z20828 Contact with and (suspected) exposure to other viral communicable diseases: Secondary | ICD-10-CM | POA: Diagnosis not present

## 2019-07-02 DIAGNOSIS — Z20828 Contact with and (suspected) exposure to other viral communicable diseases: Secondary | ICD-10-CM | POA: Diagnosis not present

## 2019-07-15 DIAGNOSIS — Z20828 Contact with and (suspected) exposure to other viral communicable diseases: Secondary | ICD-10-CM | POA: Diagnosis not present

## 2019-07-22 DIAGNOSIS — Z20828 Contact with and (suspected) exposure to other viral communicable diseases: Secondary | ICD-10-CM | POA: Diagnosis not present

## 2019-07-24 ENCOUNTER — Telehealth: Payer: Self-pay | Admitting: *Deleted

## 2019-07-24 NOTE — Telephone Encounter (Signed)
Left message for patient to return call to office to schedule vaccines.  Copied from [place] [id number]. Topic: Appointment Scheduling - Scheduling Inquiry for Clinic >> Jul 23, 2019  7:51 AM Lennox Solders wrote: Reason for CRM: pt is calling and would like to sch flu and pna vaccine together. I am not sure when she is due for pna vaccine. Please schedule

## 2019-07-29 DIAGNOSIS — Z20828 Contact with and (suspected) exposure to other viral communicable diseases: Secondary | ICD-10-CM | POA: Diagnosis not present

## 2019-07-31 ENCOUNTER — Ambulatory Visit (INDEPENDENT_AMBULATORY_CARE_PROVIDER_SITE_OTHER): Payer: BC Managed Care – PPO | Admitting: *Deleted

## 2019-07-31 ENCOUNTER — Other Ambulatory Visit: Payer: Self-pay

## 2019-07-31 DIAGNOSIS — Z23 Encounter for immunization: Secondary | ICD-10-CM

## 2019-08-05 DIAGNOSIS — Z20828 Contact with and (suspected) exposure to other viral communicable diseases: Secondary | ICD-10-CM | POA: Diagnosis not present

## 2019-08-12 DIAGNOSIS — Z20828 Contact with and (suspected) exposure to other viral communicable diseases: Secondary | ICD-10-CM | POA: Diagnosis not present

## 2019-08-19 ENCOUNTER — Ambulatory Visit (INDEPENDENT_AMBULATORY_CARE_PROVIDER_SITE_OTHER): Payer: BC Managed Care – PPO | Admitting: Family Medicine

## 2019-08-19 ENCOUNTER — Encounter: Payer: Self-pay | Admitting: Family Medicine

## 2019-08-19 ENCOUNTER — Other Ambulatory Visit: Payer: Self-pay

## 2019-08-19 VITALS — BP 132/80 | HR 97 | Temp 98.6°F | Ht 60.0 in | Wt 169.8 lb

## 2019-08-19 DIAGNOSIS — E7800 Pure hypercholesterolemia, unspecified: Secondary | ICD-10-CM

## 2019-08-19 DIAGNOSIS — E049 Nontoxic goiter, unspecified: Secondary | ICD-10-CM | POA: Diagnosis not present

## 2019-08-19 DIAGNOSIS — J45901 Unspecified asthma with (acute) exacerbation: Secondary | ICD-10-CM | POA: Diagnosis not present

## 2019-08-19 DIAGNOSIS — Z130 Encounter for screening for diseases of the blood and blood-forming organs and certain disorders involving the immune mechanism: Secondary | ICD-10-CM | POA: Diagnosis not present

## 2019-08-19 DIAGNOSIS — Z1329 Encounter for screening for other suspected endocrine disorder: Secondary | ICD-10-CM | POA: Diagnosis not present

## 2019-08-19 DIAGNOSIS — Z0000 Encounter for general adult medical examination without abnormal findings: Secondary | ICD-10-CM | POA: Diagnosis not present

## 2019-08-19 DIAGNOSIS — Z13228 Encounter for screening for other metabolic disorders: Secondary | ICD-10-CM

## 2019-08-19 DIAGNOSIS — E559 Vitamin D deficiency, unspecified: Secondary | ICD-10-CM | POA: Diagnosis not present

## 2019-08-19 LAB — VITAMIN D 25 HYDROXY (VIT D DEFICIENCY, FRACTURES): VITD: 27.89 ng/mL — ABNORMAL LOW (ref 30.00–100.00)

## 2019-08-19 LAB — BASIC METABOLIC PANEL
BUN: 15 mg/dL (ref 6–23)
CO2: 26 mEq/L (ref 19–32)
Calcium: 9.5 mg/dL (ref 8.4–10.5)
Chloride: 101 mEq/L (ref 96–112)
Creatinine, Ser: 0.67 mg/dL (ref 0.40–1.20)
GFR: 89.71 mL/min (ref >60.00)
Glucose, Bld: 97 mg/dL (ref 70–99)
Potassium: 3.9 mEq/L (ref 3.5–5.1)
Sodium: 136 mEq/L (ref 135–145)

## 2019-08-19 LAB — HEMOGLOBIN A1C: Hgb A1c MFr Bld: 5.8 % (ref 4.6–6.5)

## 2019-08-19 LAB — LIPID PANEL
Cholesterol: 242 mg/dL — ABNORMAL HIGH (ref 0–200)
HDL: 59.2 mg/dL (ref >39.00)
LDL Cholesterol: 158 mg/dL — ABNORMAL HIGH (ref 0–99)
NonHDL: 182.65
Total CHOL/HDL Ratio: 4
Triglycerides: 123 mg/dL (ref 0.0–149.0)
VLDL: 24.6 mg/dL (ref 0.0–40.0)

## 2019-08-19 LAB — TSH: TSH: 1.45 u[IU]/mL (ref 0.35–4.50)

## 2019-08-19 MED ORDER — ALBUTEROL SULFATE HFA 108 (90 BASE) MCG/ACT IN AERS: INHALATION_SPRAY | RESPIRATORY_TRACT | 3 refills | Status: DC

## 2019-08-19 NOTE — Progress Notes (Signed)
HPI:  Chief Complaint  Patient presents with  . Annual Exam    [name] is a 58 y.o. female, who is here today for her routine CPE. Last CPE:07/02/2018.  She lives with her husband of 30+ years.  Regular exercise:Recently started light wt lifting and leg exercises 2-3 times per week. Healthful diet:Not consistently.She has been eating more since COVID 19 pandemia started.  Immunization History  Administered Date(s) Administered  . Influenza,inj,Quad PF,6+ Mos 07/31/2019  . Pneumococcal Polysaccharide-23 07/09/2012  . Tdap 05/25/2007, 07/02/2018    Last Pap smear:07/02/2018 negative,HPV not detected. History of abnormal Pap smear:30 years ago, s/p colpo Bx. History of STDs:Negative.  Menarche:58 years old G:1 L:1 A:0  Mammogram: 08/2018. Colon cancer screening: 10 years ago. DEXA: N/A  Hep C screening: 03/2017 NR.  Vit D def, she is on OTC Vit D 1000 U daily.  HLD: Currently on non pharmacologic treatment.  Lab Results  Component Value Date   CHOL 209 (H) 07/02/2018   HDL 69.90 07/02/2018   LDLCALC 123 (H) 07/02/2018   TRIG 80.0 07/02/2018   CHOLHDL 3 07/02/2018   She was evaluated by ortho because shoulder pain, found calcification right shoulder. Received sub acromial injection, improved.  Asthma: She is on Albuterol inh, last used 2 weeks ago. Mask use seems to exacerbate problems.  Review of Systems  Constitutional: Negative for appetite change, fatigue and fever.  HENT: Negative for dental problem, hearing loss, mouth sores, sore throat, trouble swallowing and voice change.   Eyes: Negative for redness and visual disturbance.  Respiratory: Negative for cough, shortness of breath and wheezing.   Cardiovascular: Negative for chest pain and leg swelling.  Gastrointestinal: Negative for abdominal pain, nausea and vomiting.       No changes in bowel habits.  Endocrine: Negative for cold intolerance, heat intolerance, polydipsia,  polyphagia and polyuria.  Genitourinary: Negative for decreased urine volume, dysuria, hematuria, vaginal bleeding and vaginal discharge.  Musculoskeletal: Positive for arthralgias, no gait problem or myalgias.  Skin: Negative for color change and rash.  Allergic/Immunologic: Positive for environmental allergies.  Neurological: Negative for syncope, weakness and headaches.  Hematological: Negative for adenopathy. Does not bruise/bleed easily.  Psychiatric/Behavioral: Negative for confusion and sleep disturbance. The patient is nervous/anxious.   All other systems reviewed and are negative.    Current Outpatient Medications on File Prior to Visit  Medication Sig Dispense Refill  . aspirin EC 81 MG tablet Take 81 mg by mouth daily.    . cholecalciferol (VITAMIN D) 1000 UNITS tablet Take 1,000 Units by mouth daily.    . fish oil-omega-3 fatty acids 1000 MG capsule Take 1 g by mouth daily.    Marland Kitchen loratadine (CLARITIN) 10 MG tablet Take 10 mg by mouth as needed for allergies.     No current facility-administered medications on file prior to visit.     Past Medical History:  Diagnosis Date  . Allergy   . Asthma     Allergies  Allergen Reactions  . Morphine And Related   . Latex Rash    Social History   Socioeconomic History  . Marital status: Married    Spouse name: Not on file  . Number of children: Not on file  . Years of education: Not on file  . Highest education level: Not on file  Occupational History  . Not on file  Social Needs  . Financial resource strain: Not on file  . Food insecurity    Worry:  Not on file    Inability: Not on file  . Transportation needs    Medical: Not on file    Non-medical: Not on file  Tobacco Use  . Smoking status: Never Smoker  . Smokeless tobacco: Never Used  Substance and Sexual Activity  . Alcohol use: Yes    Comment: occasionally  . Drug use: No  . Sexual activity: Not on file  Lifestyle  . Physical activity    Days per week:  Not on file    Minutes per session: Not on file  . Stress: Not on file  Relationships  . Social Musicianconnections    Talks on phone: Not on file    Gets together: Not on file    Attends religious service: Not on file    Active member of club or organization: Not on file    Attends meetings of clubs or organizations: Not on file    Relationship status: Not on file  Other Topics Concern  . Not on file  Social History Narrative  . Not on file    Today's Vitals   08/19/19 0720  BP: 132/80  Pulse: 97  Temp: 98.6 F (37 C)  TempSrc: Temporal  SpO2: 99%  Weight: 169 lb 12.8 oz (77 kg)  Height: 5' (1.524 m)    Body mass index is 33.16 kg/m.  Wt Readings from Last 3 Encounters:  08/19/19 169 lb 12.8 oz (77 kg)  12/19/18 167 lb 4 oz (75.9 kg)  07/02/18 168 lb 4 oz (76.3 kg)    Physical Exam  Nursing note and vitals reviewed. Constitutional: She is oriented to person, place, and time. She appears well-developed. No distress.  HENT:  Head: Normocephalic and atraumatic.  Right Ear: Hearing, tympanic membrane, external ear and ear canal normal. Excess cerumen, TM seen partially. Left Ear: Hearing, tympanic membrane, external ear and ear canal normal. Excess cerumen, TM seen partially. Mouth/Throat: Uvula is midline, oropharynx is clear and moist and mucous membranes are normal.  Eyes: Pupils are equal, round, and reactive to light. Conjunctivae and EOM are normal.  Neck: No tracheal deviation present. ? thyromegaly present, right side. Cardiovascular: Normal rate and regular rhythm.  No murmur heard. Pulses:      Dorsalis pedis pulses are 2+ on the right side, and 2+ on the left side.  Respiratory: Effort normal and breath sounds normal. No respiratory distress.  GI: Soft. She exhibits no mass. There is no hepatomegaly. There is no tenderness.  Genitourinary: Comments: Breast: No masses, skin abnormalities, or nipple discharge appreciated bilateral. Musculoskeletal:         General: No edema.     Comments: No signs of synovitis appreciated.  Lymphadenopathy:    She has no cervical or axillary adenopathy.       Right: No supraclavicular adenopathy present.       Left: No supraclavicular adenopathy present.  Neurological: She is alert and oriented to person, place, and time. She has normal strength. No cranial nerve deficit. Coordination and gait normal.  Reflex Scores:      Bicep reflexes are 2+ on the right side and 2+ on the left side.      Patellar reflexes are 2+ on the right side and 2+ on the left side. Skin: Skin is warm. No rash noted. No erythema.  Psychiatric: She has a normal mood and affect. Well groomed,good eye contact.   ASSESSMENT AND PLAN:  Ms. Karen Ellis was seen today for annual exam.  Diagnoses and all orders  for this visit:  Orders Placed This Encounter  Procedures  . US THYROID  . Basic metabolic panel  . Hemoglobin A1c  . Lipid panel  . TSH  . VITAMIN D 25 Hydroxy (Vit-D Deficiency, Fractures)   Lab Results  Component Value Date   TSH 1.45 08/19/2019   Lab Results  Component Value Date   CREATININE 0.67 08/19/2019   BUN 15 08/19/2019   NA 136 08/19/2019   K 3.9 08/19/2019   CL 101 08/19/2019   CO2 26 08/19/2019   Lab Results  Component Value Date   HGBA1C 5.8 08/19/2019   Lab Results  Component Value Date   CHOL 242 (H) 08/19/2019   HDL 59.20 08/19/2019   LDLCALC 158 (H) 08/19/2019   TRIG 123.0 08/19/2019   CHOLHDL 4 08/19/2019     Routine general medical examination at a health care facility We discussed the importance of regular physical activity and healthy diet for prevention of chronic illness and/or complications. Preventive guidelines reviewed. Vaccination up to date. Colonoscopy will be arranged by pt. Ca++ and vit D supplementation to continue. Next CPE in a year.  The 10-year ASCVD risk score Mikey Bussing DC Brooke Bonito., et al., 2013) is: 3.9%   Values used to calculate the score:     Age: 63 years     Sex:  Female     Is Non-Hispanic African American: No     Diabetic: No     Tobacco smoker: No     Systolic Blood Pressure: 960 mmHg     Is BP treated: No     HDL Cholesterol: 59.2 mg/dL     Total Cholesterol: 242 mg/dL  Mild asthma with exacerbation, unspecified whether persistent Otherwise well controlled. No changes in Albuterol inh, 2 puff q 4-6 hours prn. I frequency of exacerbations increase to 1-2 ties per week,inctruxted to arrange f/u appt.  -     albuterol (PROAIR HFA) 108 (90 Base) MCG/ACT inhaler; TAKE 2 PUFFS BY MOUTH EVERY 6 HOURS AS NEEDED FOR WHEEZE OR SHORTNESS OF BREATH  Hypercholesterolemia Continue low fat diet. Further recommendations according to lab results.  Vitamin D deficiency No changes in current management, will follow 37 OH vit D done today and will give further recommendations accordingly.  Enlarged thyroid gland ? Right nodule. Further recommendations will be given according to labs/imaging results.  Screening for endocrine, metabolic and immunity disorder -     Basic metabolic panel -     Hemoglobin A1c    Return in 1 year (on 08/18/2020) for cpe.      Adream Parzych [place], MD [facility]. [place] office.

## 2019-08-19 NOTE — Patient Instructions (Signed)
Today you have you routine preventive visit. A few things to remember from today's visit:   Routine general medical examination at a health care facility  Mild asthma with exacerbation, unspecified whether persistent - Plan: albuterol (PROAIR HFA) 108 (90 Base) MCG/ACT inhaler  Hypercholesterolemia - Plan: Lipid panel  Vitamin D deficiency - Plan: VITAMIN D 25 Hydroxy (Vit-D Deficiency, Fractures)  Enlarged thyroid gland - Plan: TSH, US THYROID  Screening for endocrine, metabolic and immunity disorder - Plan: Basic metabolic panel, Hemoglobin A1c    At least 150 minutes of moderate exercise per week, daily brisk walking for 15-30 min is a good exercise option. Healthy diet low in saturated (animal) fats and sweets and consisting of fresh fruits and vegetables, lean meats such as fish and white chicken and whole grains.  These are some of recommendations for screening depending of age and risk factors:   - Vaccines:  Tdap vaccine every 10 years.  Shingles vaccine recommended at age 34, could be given after 60 years of age but not sure about insurance coverage.   Pneumonia vaccines:  Prevnar 13 at 65 and Pneumovax at 32. Sometimes Pneumovax is giving earlier if history of smoking, lung disease,diabetes,kidney disease among some.    Screening for diabetes at age 17 and every 3 years.  Cervical cancer prevention:  Pap smear starts at 60 years of age and continues periodically until 60 years old in low risk women. Pap smear every 3 years between 34 and 55 years old. Pap smear every 3-5 years between women 42 and older if pap smear negative and HPV screening negative.   -Breast cancer: Mammogram: There is disagreement between experts about when to start screening in low risk asymptomatic female but recent recommendations are to start screening at 41 and not later than 60 years old , every 1-2 years and after 60 yo q 2 years. Screening is recommended until 60 years old but some  women can continue screening depending of healthy issues.   Colon cancer screening: starts at 60 years old until 60 years old.  Also recommended:  1. Dental visit- Brush and floss your teeth twice daily; visit your dentist twice a year. 2. Eye doctor- Get an eye exam at least every 2 years. 3. Helmet use- Always wear a helmet when riding a bicycle, motorcycle, rollerblading or skateboarding. 4. Safe sex- If you may be exposed to sexually transmitted infections, use a condom. 5. Seat belts- Seat belts can save your live; always wear one. 6. Smoke/Carbon Monoxide detectors- These detectors need to be installed on the appropriate level of your home. Replace batteries at least once a year. 7. Skin cancer- When out in the sun please cover up and use sunscreen 15 SPF or higher. 8. Violence- If anyone is threatening or hurting you, please tell your healthcare provider.  9. Drink alcohol in moderation- Limit alcohol intake to one drink or less per day. Never drink and drive.

## 2019-08-20 ENCOUNTER — Other Ambulatory Visit: Payer: Self-pay | Admitting: Family Medicine

## 2019-08-20 DIAGNOSIS — Z1231 Encounter for screening mammogram for malignant neoplasm of breast: Secondary | ICD-10-CM

## 2019-08-26 DIAGNOSIS — Z20828 Contact with and (suspected) exposure to other viral communicable diseases: Secondary | ICD-10-CM | POA: Diagnosis not present

## 2019-09-02 ENCOUNTER — Ambulatory Visit
Admission: RE | Admit: 2019-09-02 | Discharge: 2019-09-02 | Disposition: A | Discharge status: Home / Self Care | Payer: BC Managed Care – PPO | Source: Ambulatory Visit | Attending: Family Medicine | Admitting: Family Medicine

## 2019-09-02 DIAGNOSIS — E049 Nontoxic goiter, unspecified: Secondary | ICD-10-CM

## 2019-09-02 DIAGNOSIS — E041 Nontoxic single thyroid nodule: Secondary | ICD-10-CM | POA: Diagnosis not present

## 2019-09-09 ENCOUNTER — Telehealth: Payer: Self-pay

## 2019-09-09 NOTE — Telephone Encounter (Signed)
Copied from [place] [id number]. Topic: General - Other >> Sep 09, 2019 10:17 AM Leward Quan A wrote: Reason for CRM: Patient called to ask the nurse to give her a call back when she have a chance so that she can discuss the results of the ultrasound. States that she read it on My Chart but does not understand it Please call Ph#  [phone]

## 2019-09-09 NOTE — Telephone Encounter (Signed)
Please advise. I do not see any result notes on this. 

## 2019-09-10 ENCOUNTER — Encounter: Payer: Self-pay | Admitting: Family Medicine

## 2019-09-11 NOTE — Telephone Encounter (Signed)
Results of recent thyroid US were already sent to pt through My chart. Betty Martinique, MD

## 2019-10-16 ENCOUNTER — Ambulatory Visit: Payer: BC Managed Care – PPO

## 2019-10-22 ENCOUNTER — Telehealth (INDEPENDENT_AMBULATORY_CARE_PROVIDER_SITE_OTHER): Payer: BC Managed Care – PPO | Admitting: Family Medicine

## 2019-10-22 ENCOUNTER — Encounter: Payer: Self-pay | Admitting: Family Medicine

## 2019-10-22 VITALS — BP 120/78 | Ht 60.0 in

## 2019-10-22 DIAGNOSIS — R0602 Shortness of breath: Secondary | ICD-10-CM

## 2019-10-22 DIAGNOSIS — J45901 Unspecified asthma with (acute) exacerbation: Secondary | ICD-10-CM

## 2019-10-22 MED ORDER — BUDESONIDE-FORMOTEROL FUMARATE 80-4.5 MCG/ACT IN AERO: 2.0000 | INHALATION_SPRAY | Freq: Two times a day (BID) | RESPIRATORY_TRACT | 3 refills | Status: DC

## 2019-10-22 MED ORDER — ALBUTEROL SULFATE HFA 108 (90 BASE) MCG/ACT IN AERS: INHALATION_SPRAY | RESPIRATORY_TRACT | 3 refills | Status: DC

## 2019-10-22 MED ORDER — AEROCHAMBER PLUS MISC: 1 refills | Status: AC

## 2019-10-22 NOTE — Progress Notes (Signed)
Virtual Visit via Telephone Note  I connected with Karen Ellis on 10/22/19 at  3:30 PM EST by telephone and verified that I am speaking with the correct person using two identifiers.   I discussed the limitations, risks, security and privacy concerns of performing an evaluation and management service by telephone and the availability of in person appointments. I also discussed with the patient that there may be a patient responsible charge related to this service. The patient expressed understanding and agreed to proceed.  Location patient: home Location provider: work or home office Participants present for the call: patient, provider Patient did not have a visit in the prior 7 days to address this/these issue(s).   History of Present Illness: Ms Karen Ellis is a 59 yo female with hx of asthma and obesity, who is c/o SOB. For the past couple weeks she has needed Albuterol inhaler more often, usually while she is at work, daily. Last time inh was used this am.  Occasionally she has some wheezing, no cough. She wonders if symptoms are caused by N95 mask, which is now mandatory for the past 2 weeks. She did not have much problems with the mask she was wearing before.  When she has symptoms she goes out to remove the mask and use her inhaler, she feels better for about 4 to 5 hours. Her husband has also noted louder snoring while sleeping, no episodes of apnea.  She works in a [place], she gets tested for COVID-19 twice per week. Some of the residents have been positive for COVID-19, she has not been in direct contact with this patient but this is causing some stress. According to pt,her supervisor recommended asking for an FMLA due to her hx of asthma.  Negative for fever, chills, nasal congestion, sore throat, rhinorrhea, changes in smell or taste, CP, palpitations, or diaphoresis.  Planning on getting COVID-19 vaccine tomorrow.   Observations/Objective: Patient sounds  cheerful and well on the phone. I do not appreciate any SOB. Speech and thought processing are grossly intact. Patient reported vitals:BP 120/78   Ht 5' (1.524 m)   BMI 33.16 kg/m   Assessment and Plan:  1. Mild asthma with exacerbation, unspecified whether persistent Not well controlled. I do not think imaging is needed at this time. Albuterol inh 2 puff every 6 hours for a week then as needed for wheezing or shortness of breath.  Symbicort 80-4.5 mcg 2 puffs twice daily started today. Instructed about warning signs. Follow-up in 10 to 14 days, before if needed.  I do not think FMLA is feasible at this time. Recommend following contact precautions advised by the CDC. I offered a temporal note for work, she prefers to hold on this, she will let me know if she changes up her mind.  - albuterol (PROAIR HFA) 108 (90 Base) MCG/ACT inhaler; TAKE 2 PUFFS BY MOUTH EVERY 6 HOURS AS NEEDED FOR WHEEZE OR SHORTNESS OF BREATH  Dispense: 18 g; Refill: 3  2. SOB (shortness of breath) We discussed all possible etiologies of shortness of breath, which include poor tolerance to type of mask she is wearing at work. History does not suggest a cardiac etiology. Recommend trying a different N95 mask design, that may be more comfortable for her.  F/U in 10-14 days.  Follow Up Instructions:  Return 10-14 days, for asthma/SOB. Before if needed..  I did not refer this patient for an OV in the next 24 hours for this/these issue(s).  I discussed the assessment  and treatment plan with the patient. She was provided an opportunity to ask questions and all were answered. She agreed with the plan and demonstrated an understanding of the instructions.    I provided 14-15 minutes of non-face-to-face time during this encounter.   Karen Wemhoff [place], MD

## 2019-10-28 ENCOUNTER — Ambulatory Visit: Payer: Self-pay

## 2019-10-29 ENCOUNTER — Ambulatory Visit
Admission: RE | Admit: 2019-10-29 | Discharge: 2019-10-29 | Disposition: A | Discharge status: Home / Self Care | Payer: BC Managed Care – PPO | Source: Ambulatory Visit | Attending: Family Medicine | Admitting: Family Medicine

## 2019-10-29 ENCOUNTER — Other Ambulatory Visit: Payer: Self-pay

## 2019-10-29 DIAGNOSIS — Z1231 Encounter for screening mammogram for malignant neoplasm of breast: Secondary | ICD-10-CM

## 2020-01-29 DIAGNOSIS — M722 Plantar fascial fibromatosis: Secondary | ICD-10-CM | POA: Diagnosis not present

## 2020-05-20 DIAGNOSIS — D122 Benign neoplasm of ascending colon: Secondary | ICD-10-CM | POA: Diagnosis not present

## 2020-05-20 DIAGNOSIS — Z1211 Encounter for screening for malignant neoplasm of colon: Secondary | ICD-10-CM | POA: Diagnosis not present

## 2020-05-20 DIAGNOSIS — K635 Polyp of colon: Secondary | ICD-10-CM | POA: Diagnosis not present

## 2020-05-20 DIAGNOSIS — D120 Benign neoplasm of cecum: Secondary | ICD-10-CM | POA: Diagnosis not present

## 2020-07-29 ENCOUNTER — Encounter: Payer: Self-pay | Admitting: Family Medicine

## 2020-08-21 ENCOUNTER — Encounter: Payer: BC Managed Care – PPO | Admitting: Family Medicine

## 2020-08-25 NOTE — Progress Notes (Deleted)
HPI: [name] is a 59 y.o. female, who is here today for her routine physical.  Last CPE: 08/19/19  Regular exercise 3 or more time per week: *** Following a healthy diet: *** She lives with her husband, 31+ years of marriage.  Chronic medical problems: Asthma,HLD,vit D def.***  Pap smear:07/02/18 Negative for malignancy and HPV was not detected.*** Hx of abnormal pap smears: Not in thepast 10 years. About 30-31 years ago she underwent colpo Bx. Hx of STD's ***  Immunization History  Administered Date(s) Administered  . Influenza,inj,Quad PF,6+ Mos 07/31/2019  . Moderna SARS-COVID-2 Vaccination 08/11/2020  . Pneumococcal Polysaccharide-23 07/09/2012  . Tdap 05/25/2007, 07/02/2018   Menarche:59 years old G:1 L:1 A:0   Hep C screening: 03/2017 NR.  Mammogram: 10/30/19 Bi-Rads 2. Colonoscopy: 05/20/20. DEXA: N/A   She has *** concerns today.  Lab Results  Component Value Date   CHOL 242 (H) 08/19/2019   HDL 59.20 08/19/2019   LDLCALC 158 (H) 08/19/2019   TRIG 123.0 08/19/2019   CHOLHDL 4 08/19/2019    Vit D def: *** Last 25 OH vit D 27 in 08/2019.  Lab Results  Component Value Date   HGBA1C 5.8 08/19/2019     Review of Systems  Current Outpatient Medications on File Prior to Visit  Medication Sig Dispense Refill  . albuterol (PROAIR HFA) 108 (90 Base) MCG/ACT inhaler TAKE 2 PUFFS BY MOUTH EVERY 6 HOURS AS NEEDED FOR WHEEZE OR SHORTNESS OF BREATH 18 g 3  . aspirin EC 81 MG tablet Take 81 mg by mouth daily.    . budesonide-formoterol (SYMBICORT) 80-4.5 MCG/ACT inhaler Inhale 2 puffs into the lungs 2 (two) times daily. 1 Inhaler 3  . cholecalciferol (VITAMIN D) 1000 UNITS tablet Take 1,000 Units by mouth daily.    . fish oil-omega-3 fatty acids 1000 MG capsule Take 1 g by mouth daily.    Marland Kitchen loratadine (CLARITIN) 10 MG tablet Take 10 mg by mouth as needed for allergies.    Marland Kitchen Spacer/Aero-Holding Chambers (AEROCHAMBER PLUS) inhaler Use as  instructed to use with inahaler. 1 each 1   No current facility-administered medications on file prior to visit.     Past Medical History:  Diagnosis Date  . Allergy   . Asthma     Past Surgical History:  Procedure Laterality Date  . CESAREAN SECTION  1990  . TUBAL LIGATION  1994    Allergies  Allergen Reactions  . Morphine And Related   . Latex Rash    Family History  Problem Relation Age of Onset  . Heart disease Father 84       CAD  . Heart disease Brother        arrhythmias  . Heart disease Daughter        CAD  . Heart disease Brother 37       CAD  . Breast cancer Neg Hx     Social History   Socioeconomic History  . Marital status: Married    Spouse name: Not on file  . Number of children: Not on file  . Years of education: Not on file  . Highest education level: Not on file  Occupational History  . Not on file  Tobacco Use  . Smoking status: Never Smoker  . Smokeless tobacco: Never Used  Substance and Sexual Activity  . Alcohol use: Yes    Comment: occasionally  . Drug use: No  . Sexual activity: Not on file  Other Topics Concern  .  Not on file  Social History Narrative  . Not on file   Social Determinants of Health   Financial Resource Strain:   . Difficulty of Paying Living Expenses: Not on file  Food Insecurity:   . Worried About Programme researcher, broadcasting/film/video in the Last Year: Not on file  . Ran Out of Food in the Last Year: Not on file  Transportation Needs:   . Lack of Transportation (Medical): Not on file  . Lack of Transportation (Non-Medical): Not on file  Physical Activity:   . Days of Exercise per Week: Not on file  . Minutes of Exercise per Session: Not on file  Stress:   . Feeling of Stress : Not on file  Social Connections:   . Frequency of Communication with Friends and Family: Not on file  . Frequency of Social Gatherings with Friends and Family: Not on file  . Attends Religious Services: Not on file  . Active Member of Clubs or  Organizations: Not on file  . Attends Banker Meetings: Not on file  . Marital Status: Not on file     There were no vitals filed for this visit. There is no height or weight on file to calculate BMI.   Wt Readings from Last 3 Encounters:  08/19/19 169 lb 12.8 oz (77 kg)  12/19/18 167 lb 4 oz (75.9 kg)  07/02/18 168 lb 4 oz (76.3 kg)     Physical Exam    ASSESSMENT AND PLAN:  Ms. Karen Ellis was here today annual physical examination.     No orders of the defined types were placed in this encounter.   Diagnoses and all orders for this visit:  Hypercholesterolemia  Vitamin D deficiency    No problem-specific Assessment & Plan notes found for this encounter.            No follow-ups on file.          Sakai Heinle G. [place], MD  [facility]. [place] office.

## 2020-08-26 ENCOUNTER — Encounter: Payer: BC Managed Care – PPO | Admitting: Family Medicine

## 2020-08-26 ENCOUNTER — Other Ambulatory Visit: Payer: Self-pay

## 2020-08-26 DIAGNOSIS — E559 Vitamin D deficiency, unspecified: Secondary | ICD-10-CM

## 2020-08-26 DIAGNOSIS — E041 Nontoxic single thyroid nodule: Secondary | ICD-10-CM

## 2020-08-26 DIAGNOSIS — E7800 Pure hypercholesterolemia, unspecified: Secondary | ICD-10-CM

## 2020-09-07 ENCOUNTER — Encounter: Payer: BC Managed Care – PPO | Admitting: Family Medicine

## 2020-09-09 ENCOUNTER — Encounter: Payer: Self-pay | Admitting: Family Medicine

## 2020-09-09 ENCOUNTER — Other Ambulatory Visit: Payer: Self-pay

## 2020-09-09 ENCOUNTER — Ambulatory Visit (INDEPENDENT_AMBULATORY_CARE_PROVIDER_SITE_OTHER): Payer: BC Managed Care – PPO | Admitting: Family Medicine

## 2020-09-09 VITALS — BP 128/80 | HR 100 | Resp 16 | Ht 60.0 in | Wt 166.2 lb

## 2020-09-09 DIAGNOSIS — E049 Nontoxic goiter, unspecified: Secondary | ICD-10-CM

## 2020-09-09 DIAGNOSIS — G4730 Sleep apnea, unspecified: Secondary | ICD-10-CM | POA: Diagnosis not present

## 2020-09-09 DIAGNOSIS — E7800 Pure hypercholesterolemia, unspecified: Secondary | ICD-10-CM

## 2020-09-09 DIAGNOSIS — Z0000 Encounter for general adult medical examination without abnormal findings: Secondary | ICD-10-CM

## 2020-09-09 DIAGNOSIS — E6609 Other obesity due to excess calories: Secondary | ICD-10-CM

## 2020-09-09 DIAGNOSIS — Z130 Encounter for screening for diseases of the blood and blood-forming organs and certain disorders involving the immune mechanism: Secondary | ICD-10-CM | POA: Diagnosis not present

## 2020-09-09 DIAGNOSIS — Z1329 Encounter for screening for other suspected endocrine disorder: Secondary | ICD-10-CM | POA: Diagnosis not present

## 2020-09-09 DIAGNOSIS — E559 Vitamin D deficiency, unspecified: Secondary | ICD-10-CM

## 2020-09-09 DIAGNOSIS — Z6833 Body mass index (BMI) 33.0-33.9, adult: Secondary | ICD-10-CM

## 2020-09-09 DIAGNOSIS — Z13228 Encounter for screening for other metabolic disorders: Secondary | ICD-10-CM

## 2020-09-09 DIAGNOSIS — J45901 Unspecified asthma with (acute) exacerbation: Secondary | ICD-10-CM

## 2020-09-09 NOTE — Assessment & Plan Note (Signed)
Continue Vit D 1000 U daily. Further recommendations according to 25 OH vit D result. 

## 2020-09-09 NOTE — Patient Instructions (Addendum)
Today you have you routine preventive visit.  At least 150 minutes of moderate exercise per week, daily brisk walking for 15-30 min is a good exercise option. Healthy diet low in saturated (animal) fats and sweets and consisting of fresh fruits and vegetables, lean meats such as fish and white chicken and whole grains.  These are some of recommendations for screening depending of age and risk factors:  - Vaccines:  Tdap vaccine every 10 years.  Shingles vaccine recommended at age 60, could be given after 61 years of age but not sure about insurance coverage.   Pneumonia vaccines: Pneumovax at 65. Sometimes Pneumovax is giving earlier if history of smoking, lung disease,diabetes,kidney disease among some.  Screening for diabetes at age 40 and every 3 years.  Cervical cancer prevention:  Pap smear starts at 61 years of age and continues periodically until 61 years old in low risk women. Pap smear every 3 years between 21 and 29 years old. Pap smear every 3-5 years between women 30 and older if pap smear negative and HPV screening negative.   -Breast cancer: Mammogram: There is disagreement between experts about when to start screening in low risk asymptomatic female but recent recommendations are to start screening at 40 and not later than 61 years old , every 1-2 years and after 61 yo q 2 years. Screening is recommended until 61 years old but some women can continue screening depending of healthy issues.  Colon cancer screening: Has been recently changed to 61 yo. Insurance may not cover until you are 61 years old. Screening is recommended until 61 years old.  Cholesterol disorder screening at age 45 and every 3 years. N/A  Also recommended:  1. Dental visit- Brush and floss your teeth twice daily; visit your dentist twice a year. 2. Eye doctor- Get an eye exam at least every 2 years. 3. Helmet use- Always wear a helmet when riding a bicycle, motorcycle, rollerblading or  skateboarding. 4. Safe sex- If you may be exposed to sexually transmitted infections, use a condom. 5. Seat belts- Seat belts can save your live; always wear one. 6. Smoke/Carbon Monoxide detectors- These detectors need to be installed on the appropriate level of your home. Replace batteries at least once a year. 7. Skin cancer- When out in the sun please cover up and use sunscreen 15 SPF or higher. 8. Violence- If anyone is threatening or hurting you, please tell your healthcare provider.  9. Drink alcohol in moderation- Limit alcohol intake to one drink or less per day. Never drink and drive. 10. Calcium supplementation 1000 to 1200 mg daily, ideally through your diet.  Vitamin D supplementation 800 units daily.  

## 2020-09-09 NOTE — Assessment & Plan Note (Signed)
She has lost some wt. Encouraged to be consistent with a healthy life style. We discussed pharmacologic treatment options, she would like to continue with no medication.

## 2020-09-09 NOTE — Progress Notes (Signed)
HPI: Karen Ellis is a 59 y.o. female, who is here today for her routine physical.  Last CPE: 08/19/19  Regular exercise 3 or more time per week: Walking around the neighborhood, 2-3 times for 60 min. Following a healthy diet: She decreased carbs and avoiding fried food. She has been following a healthier diet for a few months. Frustrated because she is having difficulty losing wt.  She lives with her husband, 31+ years of marriage.  Chronic medical problems: Asthma,HLD,vit D def, and obesity.  Pap smear:07/02/18 Negative for malignancy and HPV was not detected. Hx of abnormal pap smears: Not in thepast 10 years. About 30-31 years ago she underwent colpo Bx.  Immunization History  Administered Date(s) Administered  . Influenza,inj,Quad PF,6+ Mos 07/31/2019, 08/26/2020  . Moderna SARS-COVID-2 Vaccination 08/11/2020  . Pneumococcal Polysaccharide-23 07/09/2012  . Tdap 05/25/2007, 07/02/2018   Menarche:59 years old G:1 L:1 A:0   Hep C screening: 03/2017 NR. Mammogram: 10/30/19 Bi-Rads 2. Colonoscopy: 05/20/20. DEXA: N/A  HLD:She is on non pharmacologic treatment.  Lab Results  Component Value Date   CHOL 242 (H) 08/19/2019   HDL 59.20 08/19/2019   LDLCALC 158 (H) 08/19/2019   TRIG 123.0 08/19/2019   CHOLHDL 4 08/19/2019   Vit D def: She is on Vit D 1000 U daily. Last 25 OH vit D 27 in 08/2019.  Lab Results  Component Value Date   HGBA1C 5.8 08/19/2019   Fatigue today, did not sleep well last night. Snoring louder and her husband has noted apneic episodes. Sometimes morning headache. She does not feel rested when she gets up in the morning.  Asthma: She is on Symbicort 80-4.5 mcg bid. She has not needed Albuterol inh in a while.  Review of Systems  Constitutional: Negative for appetite change and fever.  HENT: Negative for dental problem, hearing loss, mouth sores and sore throat.   Eyes: Negative for redness and visual disturbance.   Respiratory: Negative for cough, shortness of breath and wheezing.   Cardiovascular: Negative for chest pain and leg swelling.  Gastrointestinal: Negative for abdominal pain, nausea and vomiting.       No changes in bowel habits.  Endocrine: Negative for cold intolerance, heat intolerance, polydipsia, polyphagia and polyuria.  Genitourinary: Negative for decreased urine volume, dysuria, hematuria, vaginal bleeding and vaginal discharge.  Musculoskeletal: Negative for gait problem and myalgias.  Skin: Negative for color change and rash.  Allergic/Immunologic: Positive for environmental allergies.  Neurological: Negative for syncope, weakness and headaches.  Hematological: Negative for adenopathy. Does not bruise/bleed easily.  Psychiatric/Behavioral: Positive for sleep disturbance. Negative for confusion.  All other systems reviewed and are negative.   Current Outpatient Medications on File Prior to Visit  Medication Sig Dispense Refill  . albuterol (PROAIR HFA) 108 (90 Base) MCG/ACT inhaler TAKE 2 PUFFS BY MOUTH EVERY 6 HOURS AS NEEDED FOR WHEEZE OR SHORTNESS OF BREATH 18 g 3  . aspirin EC 81 MG tablet Take 81 mg by mouth daily.    . cholecalciferol (VITAMIN D) 1000 UNITS tablet Take 1,000 Units by mouth daily.    . fish oil-omega-3 fatty acids 1000 MG capsule Take 1 g by mouth daily.    Marland Kitchen loratadine (CLARITIN) 10 MG tablet Take 10 mg by mouth as needed for allergies.    Marland Kitchen Spacer/Aero-Holding Chambers (AEROCHAMBER PLUS) inhaler Use as instructed to use with inahaler. 1 each 1   No current facility-administered medications on file prior to visit.   Past Medical History:  Diagnosis  Date  . Allergy   . Asthma    Past Surgical History:  Procedure Laterality Date  . CESAREAN SECTION  1990  . TUBAL LIGATION  1994    Allergies  Allergen Reactions  . Morphine And Related   . Latex Rash    Family History  Problem Relation Age of Onset  . Heart disease Father 3548       CAD  .  Heart disease Brother        arrhythmias  . Heart disease Daughter        CAD  . Heart disease Brother 1133       CAD  . Breast cancer Neg Hx     Social History   Socioeconomic History  . Marital status: Married    Spouse name: Not on file  . Number of children: Not on file  . Years of education: Not on file  . Highest education level: Not on file  Occupational History  . Not on file  Tobacco Use  . Smoking status: Never Smoker  . Smokeless tobacco: Never Used  Substance and Sexual Activity  . Alcohol use: Yes    Comment: occasionally  . Drug use: No  . Sexual activity: Not on file  Other Topics Concern  . Not on file  Social History Narrative  . Not on file   Social Determinants of Health   Financial Resource Strain:   . Difficulty of Paying Living Expenses: Not on file  Food Insecurity:   . Worried About Programme researcher, broadcasting/film/videounning Out of Food in the Last Year: Not on file  . Ran Out of Food in the Last Year: Not on file  Transportation Needs:   . Lack of Transportation (Medical): Not on file  . Lack of Transportation (Non-Medical): Not on file  Physical Activity:   . Days of Exercise per Week: Not on file  . Minutes of Exercise per Session: Not on file  Stress:   . Feeling of Stress : Not on file  Social Connections:   . Frequency of Communication with Friends and Family: Not on file  . Frequency of Social Gatherings with Friends and Family: Not on file  . Attends Religious Services: Not on file  . Active Member of Clubs or Organizations: Not on file  . Attends BankerClub or Organization Meetings: Not on file  . Marital Status: Not on file   Vitals:   09/09/20 0746  BP: 128/80  Pulse: 100  Resp: 16  SpO2: 96%   Body mass index is 32.47 kg/m.  Wt Readings from Last 3 Encounters:  09/09/20 166 lb 4 oz (75.4 kg)  08/19/19 169 lb 12.8 oz (77 kg)  12/19/18 167 lb 4 oz (75.9 kg)   Physical Exam Vitals and nursing note reviewed.  Constitutional:      General: She is not in  acute distress.    Appearance: She is well-developed.  HENT:     Head: Normocephalic and atraumatic.     Right Ear: Hearing, tympanic membrane, ear canal and external ear normal.     Left Ear: Hearing, tympanic membrane, ear canal and external ear normal.     Mouth/Throat:     Mouth: Mucous membranes are moist.     Pharynx: Oropharynx is clear. Uvula midline.  Eyes:     Extraocular Movements: Extraocular movements intact.     Conjunctiva/sclera: Conjunctivae normal.     Pupils: Pupils are equal, round, and reactive to light.  Neck:     Thyroid:  Thyromegaly present. No thyroid mass.     Trachea: No tracheal deviation.  Cardiovascular:     Rate and Rhythm: Normal rate and regular rhythm.     Pulses:          Dorsalis pedis pulses are 2+ on the right side and 2+ on the left side.     Heart sounds: No murmur heard.   Pulmonary:     Effort: Pulmonary effort is normal. No respiratory distress.     Breath sounds: Normal breath sounds.  Abdominal:     Palpations: Abdomen is soft. There is no hepatomegaly or mass.     Tenderness: There is no abdominal tenderness.  Genitourinary:    Comments: Breast: No masses,skin changes,or nipple discharge, bilateral. Musculoskeletal:     Comments: No signs of synovitis appreciated.  Lymphadenopathy:     Cervical: No cervical adenopathy.     Upper Body:     Right upper body: No supraclavicular or axillary adenopathy.     Left upper body: No supraclavicular or axillary adenopathy.  Skin:    General: Skin is warm.     Findings: No erythema or rash.  Neurological:     General: No focal deficit present.     Mental Status: She is alert and oriented to person, place, and time.     Cranial Nerves: No cranial nerve deficit.     Coordination: Coordination normal.     Gait: Gait normal.     Deep Tendon Reflexes:     Reflex Scores:      Bicep reflexes are 2+ on the right side and 2+ on the left side.      Patellar reflexes are 2+ on the right side and  2+ on the left side. Psychiatric:     Comments: Well groomed, good eye contact.    ASSESSMENT AND PLAN:  Ms. Calysta Craigo was here today annual physical examination.  Orders Placed This Encounter  Procedures  . COMPLETE METABOLIC PANEL WITH GFR  . Hemoglobin A1c  . TSH  . Lipid panel  . VITAMIN D 25 Hydroxy (Vit-D Deficiency, Fractures)  . Nocturnal polysomnography   Lab Results  Component Value Date   CHOL 226 (H) 09/09/2020   HDL 71 09/09/2020   LDLCALC 133 (H) 09/09/2020   TRIG 116 09/09/2020   CHOLHDL 3.2 09/09/2020   Lab Results  Component Value Date   CREATININE 0.62 09/09/2020   BUN 16 09/09/2020   NA 140 09/09/2020   K 4.2 09/09/2020   CL 104 09/09/2020   CO2 26 09/09/2020   Lab Results  Component Value Date   ALT 25 09/09/2020   AST 21 09/09/2020   BILITOT 0.6 09/09/2020    Routine general medical examination at a health care facility We discussed the importance of regular physical activity and healthy diet for prevention of chronic illness and/or complications. Preventive guidelines reviewed. Vaccination up to date.  Ca++ and vit D supplementation to continue. Next CPE in a year.  The 10-year ASCVD risk score Denman George DC Montez Hageman., et al., 2013) is: 3.4%   Values used to calculate the score:     Age: 31 years     Sex: Female     Is Non-Hispanic African American: No     Diabetic: No     Tobacco smoker: No     Systolic Blood Pressure: 128 mmHg     Is BP treated: No     HDL Cholesterol: 71 mg/dL     Total Cholesterol:  226 mg/dL  Sleep apnea, unspecified type We discussed Dx. Wt loss will help. Sleep study will be arranged.  Enlarged thyroid gland Stable. Thyroid US in 08/2019 right mid thyroid 7 mm, did not meet criteria for Bx or follow up. Further recommendations according to TSH result.  Screening for endocrine, metabolic and immunity disorder -     COMPLETE METABOLIC PANEL WITH GFR -     Hemoglobin A1c  Mild asthma with  exacerbation, unspecified whether persistent Problem is well controlled. Continue Symbicort 8--4.5 mg and Albuterol inh as needed.  -     budesonide-formoterol (SYMBICORT) 80-4.5 MCG/ACT inhaler; Inhale 2 puffs into the lungs 2 (two) times daily.  Vitamin D deficiency, unspecified Continue Vit D 1000 U daily. Further recommendations according to 25 OH vit D result.  Hypercholesterolemia Continue non pharmacologic treatment. Further recommendations according to 10 years CVD risk score.  Class 1 obesity with body mass index (BMI) of 33.0 to 33.9 in adult She has lost some wt. Encouraged to be consistent with a healthy life style. We discussed pharmacologic treatment options, she would like to continue with no medication.   Return in 1 year (on 09/09/2021) for cpe and f/u.  Kodey Xue G. [place], MD  [facility]. [place] office.   Today you have you routine preventive visit.  At least 150 minutes of moderate exercise per week, daily brisk walking for 15-30 min is a good exercise option. Healthy diet low in saturated (animal) fats and sweets and consisting of fresh fruits and vegetables, lean meats such as fish and white chicken and whole grains.  These are some of recommendations for screening depending of age and risk factors:  - Vaccines:  Tdap vaccine every 10 years.  Shingles vaccine recommended at age 55, could be given after 61 years of age but not sure about insurance coverage.   Pneumonia vaccines: Pneumovax at 65. Sometimes Pneumovax is giving earlier if history of smoking, lung disease,diabetes,kidney disease among some.  Screening for diabetes at age 16 and every 3 years.  Cervical cancer prevention:  Pap smear starts at 61 years of age and continues periodically until 61 years old in low risk women. Pap smear every 3 years between 31 and 49 years old. Pap smear every 3-5 years between women 30 and older if pap smear negative and HPV screening  negative.   -Breast cancer: Mammogram: There is disagreement between experts about when to start screening in low risk asymptomatic female but recent recommendations are to start screening at 63 and not later than 61 years old , every 1-2 years and after 62 yo q 2 years. Screening is recommended until 61 years old but some women can continue screening depending of healthy issues.  Colon cancer screening: Has been recently changed to 61 yo. Insurance may not cover until you are 61 years old. Screening is recommended until 61 years old.  Cholesterol disorder screening at age 70 and every 3 years. N/A  Also recommended:  1. Dental visit- Brush and floss your teeth twice daily; visit your dentist twice a year. 2. Eye doctor- Get an eye exam at least every 2 years. 3. Helmet use- Always wear a helmet when riding a bicycle, motorcycle, rollerblading or skateboarding. 4. Safe sex- If you may be exposed to sexually transmitted infections, use a condom. 5. Seat belts- Seat belts can save your live; always wear one. 6. Smoke/Carbon Monoxide detectors- These detectors need to be installed on the appropriate level of your home. Replace batteries  at least once a year. 7. Skin cancer- When out in the sun please cover up and use sunscreen 15 SPF or higher. 8. Violence- If anyone is threatening or hurting you, please tell your healthcare provider.  9. Drink alcohol in moderation- Limit alcohol intake to one drink or less per day. Never drink and drive. 10. Calcium supplementation 1000 to 1200 mg daily, ideally through your diet.  Vitamin D supplementation 800 units daily.

## 2020-09-09 NOTE — Assessment & Plan Note (Signed)
Continue non pharmacologic treatment. Further recommendations according to 10 years CVD risk score.

## 2020-09-10 LAB — COMPLETE METABOLIC PANEL WITH GFR
AG Ratio: 1.5 (calc) (ref 1.0–2.5)
ALT: 25 U/L (ref 6–29)
AST: 21 U/L (ref 10–35)
Albumin: 4.6 g/dL (ref 3.6–5.1)
Alkaline phosphatase (APISO): 63 U/L (ref 37–153)
BUN: 16 mg/dL (ref 7–25)
CO2: 26 mmol/L (ref 20–32)
Calcium: 9.9 mg/dL (ref 8.6–10.4)
Chloride: 104 mmol/L (ref 98–110)
Creat: 0.62 mg/dL (ref 0.50–0.99)
GFR, Est African American: 113 mL/min/{1.73_m2} (ref >=60)
GFR, Est Non African American: 97 mL/min/{1.73_m2} (ref >=60)
Globulin: 3.1 g/dL (calc) (ref 1.9–3.7)
Glucose, Bld: 94 mg/dL (ref 65–99)
Potassium: 4.2 mmol/L (ref 3.5–5.3)
Sodium: 140 mmol/L (ref 135–146)
Total Bilirubin: 0.6 mg/dL (ref 0.2–1.2)
Total Protein: 7.7 g/dL (ref 6.1–8.1)

## 2020-09-10 LAB — LIPID PANEL
Cholesterol: 226 mg/dL — ABNORMAL HIGH (ref <200)
HDL: 71 mg/dL (ref >=50)
LDL Cholesterol (Calc): 133 mg/dL (calc) — ABNORMAL HIGH
Non-HDL Cholesterol (Calc): 155 mg/dL (calc) — ABNORMAL HIGH (ref <130)
Total CHOL/HDL Ratio: 3.2 (calc) (ref <5.0)
Triglycerides: 116 mg/dL (ref <150)

## 2020-09-10 LAB — HEMOGLOBIN A1C
Hgb A1c MFr Bld: 5.6 % of total Hgb (ref <5.7)
Mean Plasma Glucose: 114 (calc)
eAG (mmol/L): 6.3 (calc)

## 2020-09-10 LAB — TSH: TSH: 1.13 mIU/L (ref 0.40–4.50)

## 2020-09-10 LAB — VITAMIN D 25 HYDROXY (VIT D DEFICIENCY, FRACTURES): Vit D, 25-Hydroxy: 38 ng/mL (ref 30–100)

## 2020-09-11 MED ORDER — BUDESONIDE-FORMOTEROL FUMARATE 80-4.5 MCG/ACT IN AERO: 2.0000 | INHALATION_SPRAY | Freq: Two times a day (BID) | RESPIRATORY_TRACT | 6 refills | Status: DC

## 2020-10-14 ENCOUNTER — Other Ambulatory Visit: Payer: Self-pay | Admitting: Family Medicine

## 2020-10-14 DIAGNOSIS — Z1231 Encounter for screening mammogram for malignant neoplasm of breast: Secondary | ICD-10-CM

## 2020-10-19 DIAGNOSIS — M722 Plantar fascial fibromatosis: Secondary | ICD-10-CM | POA: Diagnosis not present

## 2020-10-26 DIAGNOSIS — M71571 Other bursitis, not elsewhere classified, right ankle and foot: Secondary | ICD-10-CM | POA: Diagnosis not present

## 2020-10-26 DIAGNOSIS — M722 Plantar fascial fibromatosis: Secondary | ICD-10-CM | POA: Diagnosis not present

## 2020-10-26 DIAGNOSIS — M71572 Other bursitis, not elsewhere classified, left ankle and foot: Secondary | ICD-10-CM | POA: Diagnosis not present

## 2020-11-20 ENCOUNTER — Other Ambulatory Visit: Payer: Self-pay

## 2020-11-20 ENCOUNTER — Ambulatory Visit
Admission: RE | Admit: 2020-11-20 | Discharge: 2020-11-20 | Disposition: A | Discharge status: Home / Self Care | Payer: BC Managed Care – PPO | Source: Ambulatory Visit | Attending: Family Medicine | Admitting: Family Medicine

## 2020-11-20 DIAGNOSIS — Z1231 Encounter for screening mammogram for malignant neoplasm of breast: Secondary | ICD-10-CM

## 2021-09-14 ENCOUNTER — Encounter: Payer: BC Managed Care – PPO | Admitting: Family Medicine

## 2021-09-23 NOTE — Progress Notes (Signed)
HPI: Karen Ellis is a 60 y.o. female, who is here today for her routine physical.  Last CPE: 09/09/20 Regular exercise 3 or more time per week: She is going to the gym, 3 times per week, 45-60 min. Following a healthy diet: She is trying to do better, cooking at home.  She lives with her husband. 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  Chronic medical problems: Vit D def,asthma,HLD.  Immunization History  Administered Date(s) Administered   Influenza,inj,Quad PF,6+ Mos 07/31/2019, 08/26/2020   Moderna Sars-Covid-2 Vaccination 08/11/2020   Pneumococcal Polysaccharide-23 07/09/2012   Tdap 05/25/2007, 07/02/2018   Menarche:60 years old. G:1 L:1 A:0 Last Pap smear:07/02/2018. History of abnormal Pap smear:30 years ago, s/p colpo Bx.  Adequacy Satisfactory for evaluation. The presence or absence of an endocervical / transformation zone component cannot be determined because of atrophy.  Diagnosis NEGATIVE FOR INTRAEPITHELIAL LESIONS OR MALIGNANCY.   HPV NOT DETECTED   Comment: Normal Reference Range - NOT Detected  Material Submitted CervicoVaginal Pap [ThinPrep Imaged]   CYTOLOGY - PAP PAP RESULT    Health Maintenance  Topic Date Due   Zoster Vaccines- Shingrix (1 of 2) Never done   INFLUENZA VACCINE  05/17/2021   COVID-19 Vaccine (2 - Moderna series) 10/10/2021 (Originally 09/08/2020)   HIV Screening  08/18/2024 (Originally 05/27/1976)   MAMMOGRAM  11/20/2022   PAP SMEAR-Modifier  07/03/2023   TETANUS/TDAP  07/02/2028   COLONOSCOPY (Pts 45-57yrs Insurance coverage will need to be confirmed)  05/20/2030   Hepatitis C Screening  Completed   Pneumococcal Vaccine 22-11 Years old  Aged Out   HPV VACCINES  Aged Out   She has no concerns today.  Vitamin D deficiency: She is on vitamin D 1000 units daily.  HLD: Currently she is on nonpharmacologic treatment.  Lab Results  Component Value  Date   CHOL 226 (H) 09/09/2020   HDL 71 09/09/2020   LDLCALC 133 (H) 09/09/2020   TRIG 116 09/09/2020   CHOLHDL 3.2 09/09/2020  Asthma: She is using inhalers as needed,Symbicort 80-4.5 mcg bid prn and Albuterol inh.   Review of Systems  Constitutional:  Negative for appetite change, fatigue and fever.  HENT:  Negative for hearing loss, mouth sores and sore throat.   Eyes:  Negative for pain and visual disturbance.  Respiratory:  Negative for cough, shortness of breath and wheezing.   Cardiovascular:  Negative for chest pain and leg swelling.  Gastrointestinal:  Negative for abdominal pain, nausea and vomiting.       No changes in bowel habits.  Endocrine: Negative for cold intolerance, heat intolerance, polydipsia, polyphagia and polyuria.  Genitourinary:  Negative for decreased urine volume, dysuria, hematuria, vaginal bleeding and vaginal discharge.  Musculoskeletal:  Negative for gait problem and myalgias.  Skin:  Negative for color change and rash.  Allergic/Immunologic: Positive for environmental allergies.  Neurological:  Negative for syncope, weakness and headaches.  Hematological:  Negative for adenopathy. Does not bruise/bleed easily.  Psychiatric/Behavioral:  Negative for confusion and sleep disturbance.   All other systems reviewed and are negative.  Current Outpatient Medications on File Prior to Visit  Medication Sig Dispense Refill   albuterol (PROAIR HFA) 108 (90 Base) MCG/ACT inhaler TAKE 2 PUFFS BY MOUTH EVERY 6 HOURS AS NEEDED FOR WHEEZE OR SHORTNESS OF BREATH 18 g 3   aspirin EC 81 MG tablet Take 81 mg by mouth daily.  budesonide-formoterol (SYMBICORT) 80-4.5 MCG/ACT inhaler Inhale 2 puffs into the lungs 2 (two) times daily. 10.2 g 6   cholecalciferol (VITAMIN D) 1000 UNITS tablet Take 1,000 Units by mouth daily.     fish oil-omega-3 fatty acids 1000 MG capsule Take 1 g by mouth daily.     loratadine (CLARITIN) 10 MG tablet Take 10 mg by mouth as needed for  allergies.     Spacer/Aero-Holding Chambers (AEROCHAMBER PLUS) inhaler Use as instructed to use with inahaler. 1 each 1   No current facility-administered medications on file prior to visit.   Past Medical History:  Diagnosis Date   Allergy    Asthma    Past Surgical History:  Procedure Laterality Date   CESAREAN SECTION  1990   TUBAL LIGATION  1994   Allergies  Allergen Reactions   Morphine And Related    Latex Rash   Family History  Problem Relation Age of Onset   Heart disease Father 67       CAD   Heart disease Brother        arrhythmias   Heart disease Daughter        CAD   Heart disease Brother 40       CAD   Breast cancer Neg Hx    Social History   Socioeconomic History   Marital status: Married    Spouse name: Not on file   Number of children: Not on file   Years of education: Not on file   Highest education level: Not on file  Occupational History   Not on file  Tobacco Use   Smoking status: Never   Smokeless tobacco: Never  Substance and Sexual Activity   Alcohol use: Yes    Comment: occasionally   Drug use: No   Sexual activity: Not on file  Other Topics Concern   Not on file  Social History Narrative   Not on file   Social Determinants of Health   Financial Resource Strain: Not on file  Food Insecurity: Not on file  Transportation Needs: Not on file  Physical Activity: Not on file  Stress: Not on file  Social Connections: Not on file   Vitals:   09/24/21 0942  BP: 120/76  Pulse: 100  Resp: 16  Temp: 98.4 F (36.9 C)  SpO2: 98%   Body mass index is 32.08 kg/m.  Wt Readings from Last 3 Encounters:  09/24/21 164 lb 4 oz (74.5 kg)  09/09/20 166 lb 4 oz (75.4 kg)  08/19/19 169 lb 12.8 oz (77 kg)   Physical Exam Vitals and nursing note reviewed.  Constitutional:      General: She is not in acute distress.    Appearance: She is well-developed.  HENT:     Head: Normocephalic and atraumatic.     Right Ear: Hearing, tympanic  membrane, ear canal and external ear normal.     Left Ear: Hearing, tympanic membrane, ear canal and external ear normal.     Mouth/Throat:     Mouth: Mucous membranes are moist.     Pharynx: Oropharynx is clear. Uvula midline.  Eyes:     Extraocular Movements: Extraocular movements intact.     Conjunctiva/sclera: Conjunctivae normal.     Pupils: Pupils are equal, round, and reactive to light.  Neck:     Thyroid: Thyromegaly (Mild) present. No thyroid mass.     Trachea: No tracheal deviation.  Cardiovascular:     Rate and Rhythm: Normal rate and regular rhythm.  Pulses:          Dorsalis pedis pulses are 2+ on the right side and 2+ on the left side.     Heart sounds: No murmur heard. Pulmonary:     Effort: Pulmonary effort is normal. No respiratory distress.     Breath sounds: Normal breath sounds.  Abdominal:     Palpations: Abdomen is soft. There is no hepatomegaly or mass.     Tenderness: There is no abdominal tenderness.  Genitourinary:    Comments: Breast: No masses, nipple discharge,or skin changes bilateral. Musculoskeletal:     Comments: No signs of synovitis appreciated.  Lymphadenopathy:     Cervical: No cervical adenopathy.     Upper Body:     Right upper body: No supraclavicular adenopathy.     Left upper body: No supraclavicular adenopathy.  Skin:    General: Skin is warm.     Findings: No erythema or rash.  Neurological:     General: No focal deficit present.     Mental Status: She is alert and oriented to person, place, and time.     Cranial Nerves: No cranial nerve deficit.     Coordination: Coordination normal.     Gait: Gait normal.     Deep Tendon Reflexes:     Reflex Scores:      Bicep reflexes are 2+ on the right side and 2+ on the left side.      Patellar reflexes are 2+ on the right side and 2+ on the left side. Psychiatric:        Speech: Speech normal.     Comments: Well groomed, good eye contact.   ASSESSMENT AND PLAN:  Ms. Imanii Rood was here today annual physical examination.  Orders Placed This Encounter  Procedures   Comprehensive metabolic panel   Hemoglobin A1c   Lipid panel   VITAMIN D 25 Hydroxy (Vit-D Deficiency, Fractures)   TSH   Lab Results  Component Value Date   TSH 1.82 09/24/2021   Lab Results  Component Value Date   CREATININE 0.66 09/24/2021   BUN 19 09/24/2021   NA 135 09/24/2021   K 3.3 (L) 09/24/2021   CL 98 09/24/2021   CO2 26 09/24/2021   Lab Results  Component Value Date   ALT 28 09/24/2021   AST 26 09/24/2021   ALKPHOS 60 09/24/2021   BILITOT 0.4 09/24/2021   Lab Results  Component Value Date   CHOL 189 09/24/2021   HDL 60.30 09/24/2021   LDLCALC 109 (H) 09/24/2021   TRIG 99.0 09/24/2021   CHOLHDL 3 09/24/2021   Routine general medical examination at a health care facility We discussed the importance of regular physical activity and healthy diet for prevention of chronic illness and/or complications. Preventive guidelines reviewed. Vaccination: Prefers to hold on shingrix, not sure about coverage.  Ca++ and vit D supplementation to continue. Next CPE in a year. The 10-year ASCVD risk score (Arnett DK, et al., 2019) is: 3.3%   Values used to calculate the score:     Age: 52 years     Sex: Female     Is Non-Hispanic African American: No     Diabetic: No     Tobacco smoker: No     Systolic Blood Pressure: 123456 mmHg     Is BP treated: No     HDL Cholesterol: 60.3 mg/dL     Total Cholesterol: 189 mg/dL  Screening for endocrine, metabolic and immunity disorder -  Comprehensive metabolic panel; Future -     Hemoglobin A1c; Future  Vitamin D deficiency, unspecified Continue Vit D 1000 U daily. Further recommendations according to 25 OH vit D result.  Hypercholesterolemia Non pharmacologic treatment recommended for now. Further recommendations will be given according to 10 years CVD risk score and lipid panel numbers.   Right thyroid nodule Last  thyroid US in 2020, no further follow up was recommended. She prefers to hold on repeating thyroid US for now. Further recommendations according to TSH result.  Return in 1 year (on 09/24/2022) for CPE.  Kennette Cuthrell G. [place], MD  [facility]. [place] office.

## 2021-09-24 ENCOUNTER — Encounter: Payer: Self-pay | Admitting: Family Medicine

## 2021-09-24 ENCOUNTER — Ambulatory Visit (INDEPENDENT_AMBULATORY_CARE_PROVIDER_SITE_OTHER): Payer: 59 | Admitting: Family Medicine

## 2021-09-24 VITALS — BP 120/76 | HR 100 | Temp 98.4°F | Resp 16 | Ht 60.0 in | Wt 164.2 lb

## 2021-09-24 DIAGNOSIS — Z13228 Encounter for screening for other metabolic disorders: Secondary | ICD-10-CM

## 2021-09-24 DIAGNOSIS — Z1329 Encounter for screening for other suspected endocrine disorder: Secondary | ICD-10-CM

## 2021-09-24 DIAGNOSIS — Z130 Encounter for screening for diseases of the blood and blood-forming organs and certain disorders involving the immune mechanism: Secondary | ICD-10-CM

## 2021-09-24 DIAGNOSIS — E041 Nontoxic single thyroid nodule: Secondary | ICD-10-CM | POA: Diagnosis not present

## 2021-09-24 DIAGNOSIS — Z0000 Encounter for general adult medical examination without abnormal findings: Secondary | ICD-10-CM | POA: Diagnosis not present

## 2021-09-24 DIAGNOSIS — E559 Vitamin D deficiency, unspecified: Secondary | ICD-10-CM

## 2021-09-24 DIAGNOSIS — E7800 Pure hypercholesterolemia, unspecified: Secondary | ICD-10-CM | POA: Diagnosis not present

## 2021-09-24 DIAGNOSIS — E876 Hypokalemia: Secondary | ICD-10-CM

## 2021-09-24 LAB — COMPREHENSIVE METABOLIC PANEL
ALT: 28 U/L (ref 0–35)
AST: 26 U/L (ref 0–37)
Albumin: 3.9 g/dL (ref 3.5–5.2)
Alkaline Phosphatase: 60 U/L (ref 39–117)
BUN: 19 mg/dL (ref 6–23)
CO2: 26 mEq/L (ref 19–32)
Calcium: 9.5 mg/dL (ref 8.4–10.5)
Chloride: 98 mEq/L (ref 96–112)
Creatinine, Ser: 0.66 mg/dL (ref 0.40–1.20)
GFR: 94.08 mL/min (ref >60.00)
Glucose, Bld: 91 mg/dL (ref 70–99)
Potassium: 3.3 mEq/L — ABNORMAL LOW (ref 3.5–5.1)
Sodium: 135 mEq/L (ref 135–145)
Total Bilirubin: 0.4 mg/dL (ref 0.2–1.2)
Total Protein: 8 g/dL (ref 6.0–8.3)

## 2021-09-24 LAB — TSH: TSH: 1.82 u[IU]/mL (ref 0.35–5.50)

## 2021-09-24 LAB — LIPID PANEL
Cholesterol: 189 mg/dL (ref 0–200)
HDL: 60.3 mg/dL (ref >39.00)
LDL Cholesterol: 109 mg/dL — ABNORMAL HIGH (ref 0–99)
NonHDL: 128.9
Total CHOL/HDL Ratio: 3
Triglycerides: 99 mg/dL (ref 0.0–149.0)
VLDL: 19.8 mg/dL (ref 0.0–40.0)

## 2021-09-24 LAB — HEMOGLOBIN A1C: Hgb A1c MFr Bld: 5.9 % (ref 4.6–6.5)

## 2021-09-24 LAB — VITAMIN D 25 HYDROXY (VIT D DEFICIENCY, FRACTURES): VITD: 33.84 ng/mL (ref 30.00–100.00)

## 2021-09-24 NOTE — Assessment & Plan Note (Signed)
Non pharmacologic treatment recommended for now. Further recommendations will be given according to 10 years CVD risk score and lipid panel numbers. 

## 2021-09-24 NOTE — Assessment & Plan Note (Addendum)
Continue Vit D 1000 U daily. Further recommendations according to 25 OH vit D result. 

## 2021-09-24 NOTE — Assessment & Plan Note (Signed)
Last thyroid US in 2020, no further follow up was recommended. She prefers to hold on repeating thyroid US for now. Further recommendations according to TSH result.

## 2021-09-24 NOTE — Patient Instructions (Addendum)
A few things to remember from today's visit:  Routine general medical examination at a health care facility  Vitamin D deficiency, unspecified - Plan: VITAMIN D 25 Hydroxy (Vit-D Deficiency, Fractures)  Hypercholesterolemia - Plan: Lipid panel  Screening for endocrine, metabolic and immunity disorder - Plan: Comprehensive metabolic panel, Hemoglobin A1c  Right thyroid nodule - Plan: TSH  If you need refills please call your pharmacy. Do not use My Chart to request refills or for acute issues that need immediate attention.   Please be sure medication list is accurate. If a new problem present, please set up appointment sooner than planned today.  Mantenimiento de Radiographer, therapeutic en las mujeres Health Maintenance, Female Adoptar un estilo de vida saludable y recibir atencin preventiva son importantes para promover la salud y Counsellor. Consulte al mdico sobre: El esquema adecuado para hacerse pruebas y exmenes peridicos. Cosas que puede hacer por su cuenta para prevenir enfermedades y [place] sano. Qu debo saber sobre la dieta, el peso y el ejercicio? Consuma una dieta saludable  Consuma una dieta que incluya muchas verduras, frutas, productos lcteos con bajo contenido de [place] y Associate Professor. No consuma muchos alimentos ricos en grasas slidas, azcares agregados o sodio. Mantenga un peso saludable El ndice de masa muscular [facility]) se [place] para identificar problemas de [place]. Proporciona una estimacin de la grasa corporal basndose en el peso y la altura. Su mdico puede ayudarle a Engineer, site [facility] y a Personnel officer o Pharmacologist un peso saludable. Haga ejercicio con regularidad Haga ejercicio con regularidad. Esta es una de las prcticas ms importantes que puede hacer por su salud. La [employer] de los adultos deben seguir estas pautas: Education officer, environmental, al menos, 150 minutos de actividad fsica por semana. El ejercicio debe aumentar la frecuencia cardaca y Media planner transpirar (ejercicio de  intensidad moderada). Hacer ejercicios de fortalecimiento por lo [employer] por semana. Agregue esto a su plan de ejercicio de intensidad moderada. Pase menos tiempo sentada. Incluso la actividad fsica ligera puede ser beneficiosa. Controle sus niveles de colesterol y lpidos en la sangre Comience a realizarse anlisis de lpidos y Oncologist en la sangre a los 20 aos y luego reptalos cada 5 aos. Hgase controlar los niveles de colesterol con mayor frecuencia si: Sus niveles de lpidos y colesterol son altos. Es mayor de 40 aos. Presenta un alto riesgo de padecer enfermedades cardacas. Qu debo saber sobre las pruebas de deteccin del cncer? Segn su historia clnica y sus antecedentes familiares, es posible que deba realizarse pruebas de deteccin del cncer en diferentes edades. Esto puede incluir pruebas de deteccin de lo siguiente: Cncer de mama. Cncer de cuello uterino. Cncer colorrectal. Cncer de piel. Cncer de pulmn. Qu debo saber sobre la enfermedad cardaca, la diabetes y la hipertensin arterial? Presin arterial y enfermedad cardaca La hipertensin arterial causa enfermedades cardacas y [place] el riesgo de accidente cerebrovascular. Es ms probable que esto se manifieste en las personas que tienen lecturas de presin arterial alta o tienen sobrepeso. Hgase controlar la presin arterial: Cada 3 a 5 aos si tiene entre 18 y 86 aos. Todos los aos si es mayor de 40 aos. Diabetes Realcese exmenes de deteccin de la diabetes con regularidad. Este anlisis revisa el nivel de azcar en la sangre en [place]. Hgase las pruebas de deteccin: Cada tres aos despus de los 40 aos de edad si tiene un peso normal y un bajo riesgo de padecer diabetes. Con ms frecuencia y a partir de [place] edad inferior si tiene  sobrepeso o un alto riesgo de padecer diabetes. Qu debo saber sobre la prevencin de infecciones? Hepatitis B Si tiene un riesgo ms alto de contraer  hepatitis B, debe someterse a un examen de deteccin de este virus. Hable con el mdico para averiguar si tiene riesgo de contraer la infeccin por hepatitis B. Hepatitis C Se recomienda el anlisis a: [employer] 1945 y 1965. Todas las personas que tengan un riesgo de haber contrado hepatitis C. Enfermedades de transmisin sexual (ETS) Hgase las pruebas de Airline pilot de ITS, incluidas la gonorrea y la clamidia, si: Es sexualmente activa y es menor de [address]. Es mayor de [address], y Public affairs consultant informa que corre riesgo de tener este tipo de infecciones. La actividad sexual ha cambiado desde que le hicieron la ltima prueba de deteccin y tiene un riesgo mayor de Warehouse manager clamidia o Copy. Pregntele al mdico si usted tiene riesgo. Pregntele al mdico si usted tiene un alto riesgo de Primary school teacher VIH. El mdico tambin puede recomendarle un medicamento recetado para ayudar a evitar la infeccin por el VIH. Si elige tomar medicamentos para prevenir el VIH, primero debe [employer] de deteccin del VIH. Luego debe hacerse anlisis cada 3 meses mientras est tomando los medicamentos. Embarazo Si est por dejar de Armed forces training and education officer (fase premenopusica) y usted puede quedar [employer], busque asesoramiento antes de [place]. [place] de 400 a 800 microgramos (mcg) de cido [employer] si [place]. Pida mtodos de control de la natalidad (anticonceptivos) si desea evitar un embarazo no deseado. Osteoporosis y [place] La osteoporosis es una enfermedad en la que los huesos pierden los minerales y la fuerza por el avance de la edad. El resultado pueden ser fracturas en los [place]. Si tiene 65 aos o ms, o si est en riesgo de sufrir osteoporosis y fracturas, pregunte a su mdico si debe: Hacerse pruebas de deteccin de prdida sea. Tomar un suplemento de calcio o de vitamina D para reducir el riesgo de fracturas. Recibir terapia de reemplazo hormonal (TRH) para  tratar los sntomas de la menopausia. Siga estas indicaciones en su casa: Consumo de alcohol No beba alcohol si: Su mdico le indica no hacerlo. Est embarazada, puede estar embarazada o est tratando de [place]. Si bebe alcohol: Limite la cantidad que bebe a lo siguiente: De 0 a 1 bebida por da. Sepa cunta cantidad de alcohol hay en las bebidas que toma. En los [address], una medida equivale a una botella de cerveza de 12 oz (355 ml), un vaso de vino de 5 oz (148 ml) o un vaso de una bebida alcohlica de alta graduacin de 1 oz (44 ml). Estilo de vida No consuma ningn producto que contenga nicotina o tabaco. Estos productos incluyen cigarrillos, tabaco para Theatre manager y aparatos de vapeo, como los Administrator, Civil Service. Si necesita ayuda para dejar de consumir estos productos, consulte al mdico. No consuma drogas. No comparta agujas. Solicite ayuda a su mdico si necesita apoyo o informacin para abandonar las drogas. Indicaciones generales Realcese los estudios de rutina de [address], dentales y de Wellsite geologist. Mantngase al da con las vacunas. Infrmele a su mdico si: Se siente deprimida con frecuencia. Alguna vez ha sido vctima de [place] o no se siente seguro en su casa. Resumen Adoptar un estilo de vida saludable y recibir atencin preventiva son importantes para promover la salud y Counsellor. Siga las instrucciones del mdico acerca de una dieta saludable, el ejercicio y Chemical engineer  de pruebas o exmenes para detectar enfermedades. Siga las instrucciones del mdico con respecto al control del colesterol y la presin arterial. Esta informacin no tiene Theme park manager el consejo del mdico. Asegrese de hacerle al mdico cualquier pregunta que tenga. Document Revised: 03/11/2021 Document Reviewed: 03/11/2021 Elsevier Patient Education  2022 [employer].

## 2021-10-19 ENCOUNTER — Telehealth: Payer: Self-pay

## 2021-10-19 NOTE — Telephone Encounter (Signed)
-----   Message from Kathreen Devoid, CMA sent at 09/27/2021  8:20 AM EST ----- Repeat potassium 4 weeks.

## 2021-10-19 NOTE — Telephone Encounter (Signed)
I called and spoke with patient. She received a bill from her insurance from her physical, so she needs to see why they didn't pay before she schedules the potassium recheck.

## 2021-11-19 ENCOUNTER — Ambulatory Visit (INDEPENDENT_AMBULATORY_CARE_PROVIDER_SITE_OTHER): Payer: 59

## 2021-11-19 ENCOUNTER — Ambulatory Visit (INDEPENDENT_AMBULATORY_CARE_PROVIDER_SITE_OTHER): Payer: 59 | Admitting: Physician Assistant

## 2021-11-19 ENCOUNTER — Other Ambulatory Visit: Payer: Self-pay

## 2021-11-19 DIAGNOSIS — M25511 Pain in right shoulder: Secondary | ICD-10-CM

## 2021-11-19 MED ORDER — LIDOCAINE HCL 1 % IJ SOLN
5.0000 mL | INTRAMUSCULAR | Status: AC | PRN
  Administered 2021-11-19: 5 mL

## 2021-11-19 MED ORDER — METHYLPREDNISOLONE ACETATE 40 MG/ML IJ SUSP
80.0000 mg | INTRAMUSCULAR | Status: AC | PRN
  Administered 2021-11-19: 80 mg via INTRA_ARTICULAR

## 2021-11-19 NOTE — Progress Notes (Addendum)
Office Visit Note   Patient: Karen Ellis           Date of Birth: 01-22-1961           MRN: [id number] Visit Date: 11/19/2021              Requested by: Martinique, Betty G, MD [address] [place],  [place] [address] PCP: Martinique, Betty G, MD  Chief Complaint  Patient presents with   Right Shoulder - Pain      HPI: Patient is a pleasant 61 year old woman.  She comes in today complaining of right shoulder pain making it difficult for her to move her shoulder.  She is accompanied by her husband.  Her and her husband do lift overhead weights.  She is not sure this aggravated it.  The pain has been significant for 2 days.  Her husband says she is gotten injections before and it has helped it significantly.  Assessment & Plan: Visit Diagnoses:  1. Acute pain of right shoulder     Plan: Calcific tendinitis.  I do have concerns that she has ruptured her rotator cuff but difficult to examine today because of the pain.  I will go forward and give her an injection.  If she does not have improvement in a week or cannot get her arm above her head I would recommend an MRI of her right shoulder.  When I talked about calcification patient and her husband acknowledge that she has been told this before and that similar problems a few years ago were helped with an injection Discussed with the patient by phone that I would like to get the MRI ordered. She would like to wait at least overe the weekend  Follow-Up Instructions: No follow-ups on file.   Ortho Exam  Patient is alert, oriented, no adenopathy, well-dressed, normal affect, normal respiratory effort.  She has no palpable deformity.  She is acutely tender anteriorly.  She can only elevate her shoulder to about 90 degrees and it is very painful.  She has difficulty holding it up because of pain however cannot rule out that she has a dropped arm test.  Distal sensation is intact good grip strength  Imaging: XR Shoulder  Right  Result Date: 11/19/2021 Radiographs of her right shoulder demonstrate humeral head is well located in the glenoid fossa.  Overall well-maintained joint spacing.  No acute osseous changes.  She does have significant calcification consistent with previous history related to me.  Calcific tendinitis  No images are attached to the encounter.  Labs: Lab Results  Component Value Date   HGBA1C 5.9 09/24/2021   HGBA1C 5.6 09/09/2020   HGBA1C 5.8 08/19/2019   ESRSEDRATE 6 01/30/2017   CRP 0.3 (L) 01/30/2017   LABORGA Normal Upper Respiratory Flora 12/21/2014   LABORGA No Beta Hemolytic Streptococci Isolated 12/21/2014     Lab Results  Component Value Date   ALBUMIN 3.9 09/24/2021    No results found for: MG Lab Results  Component Value Date   VD25OH 33.84 09/24/2021   VD25OH 38 09/09/2020   VD25OH 27.89 (L) 08/19/2019    No results found for: PREALBUMIN No flowsheet data found.   There is no height or weight on file to calculate BMI.  Orders:  Orders Placed This Encounter  Procedures   XR Shoulder Right   No orders of the defined types were placed in this encounter.    Procedures: Large Joint Inj: R subacromial bursa on 11/19/2021 11:18 AM  Indications: diagnostic evaluation and pain Details: 25 G 1.5 in needle  Arthrogram: No  Medications: 5 mL lidocaine 1 %; 80 mg methylPREDNISolone acetate 40 MG/ML Outcome: tolerated well, no immediate complications Procedure, treatment alternatives, risks and benefits explained, specific risks discussed. Consent was given by the patient.     Clinical Data: No additional findings.  ROS:  All other systems negative, except as noted in the HPI. Review of Systems  Objective: Vital Signs: There were no vitals taken for this visit.  Specialty Comments:  No specialty comments available.  PMFS History: Patient Active Problem List   Diagnosis Date Noted   Right thyroid nodule 09/24/2021   Hypercholesterolemia  07/01/2018   Vitamin D deficiency, unspecified 01/30/2017   Class 1 obesity with body mass index (BMI) of 33.0 to 33.9 in adult 01/30/2017   Past Medical History:  Diagnosis Date   Allergy    Asthma     Family History  Problem Relation Age of Onset   Heart disease Father 55       CAD   Heart disease Brother        arrhythmias   Heart disease Daughter        CAD   Heart disease Brother 81       CAD   Breast cancer Neg Hx     Past Surgical History:  Procedure Laterality Date   [place]   Social History   Occupational History   Not on file  Tobacco Use   Smoking status: Never   Smokeless tobacco: Never  Substance and Sexual Activity   Alcohol use: Yes    Comment: occasionally   Drug use: No   Sexual activity: Not on file

## 2022-06-09 IMAGING — MG MM DIGITAL SCREENING BILAT W/ TOMO AND CAD
8 series · 8 of 24 positions shown · non-contrast
Comparison: Previous exam(s).

CLINICAL DATA: Screening.

EXAM:
DIGITAL SCREENING BILATERAL MAMMOGRAM WITH TOMOSYNTHESIS AND CAD
TECHNIQUE: Bilateral screening digital craniocaudal and mediolateral oblique
mammograms were obtained. Bilateral screening digital breast
tomosynthesis was performed. The images were evaluated with
computer-aided detection.

[L MLO synth-2D]
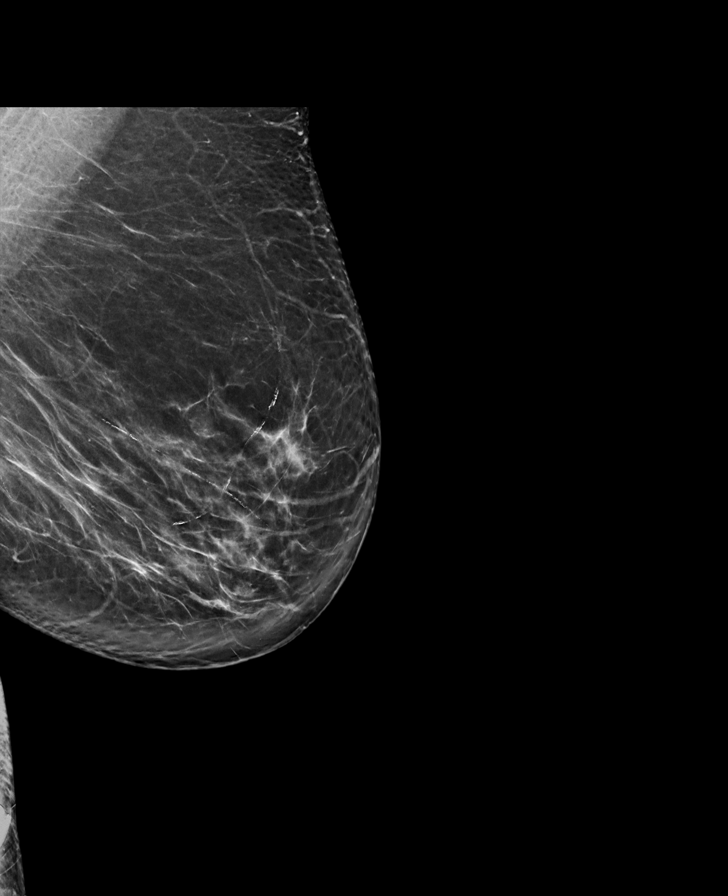

[R CC synth-2D]
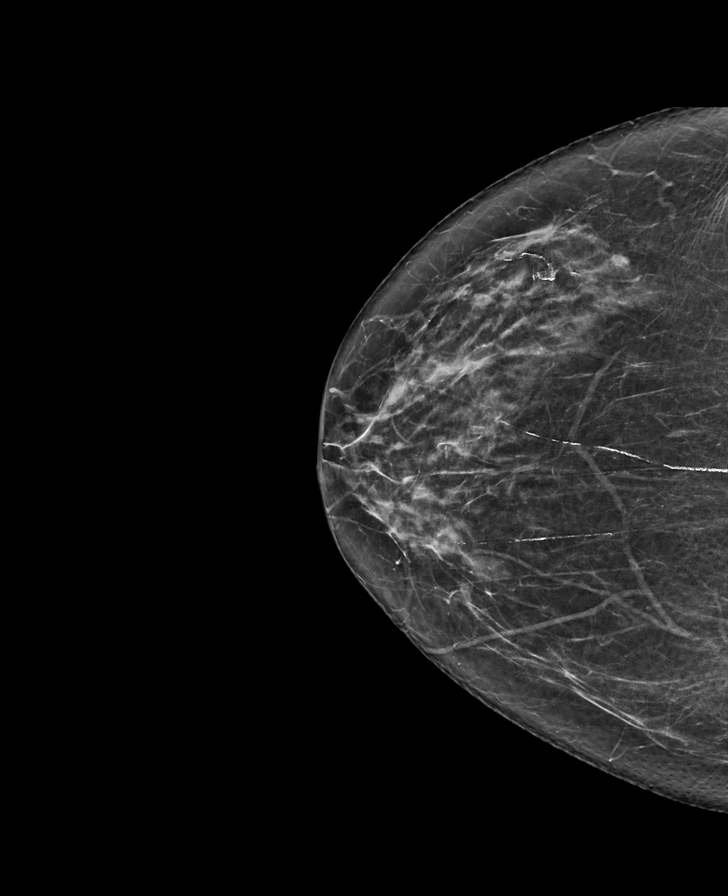

[L CC synth-2D]
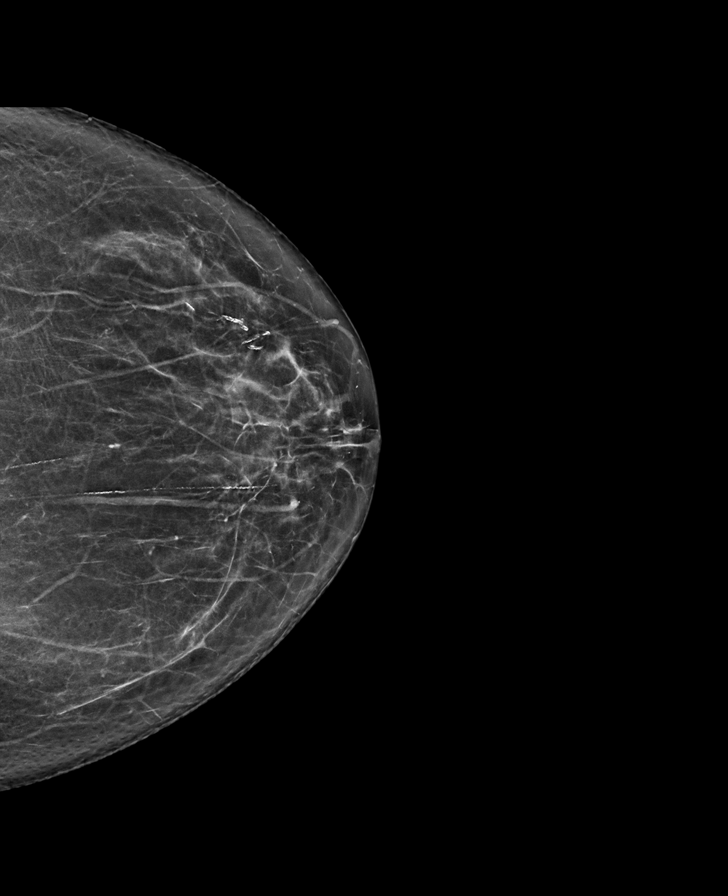

[R MLO synth-2D]
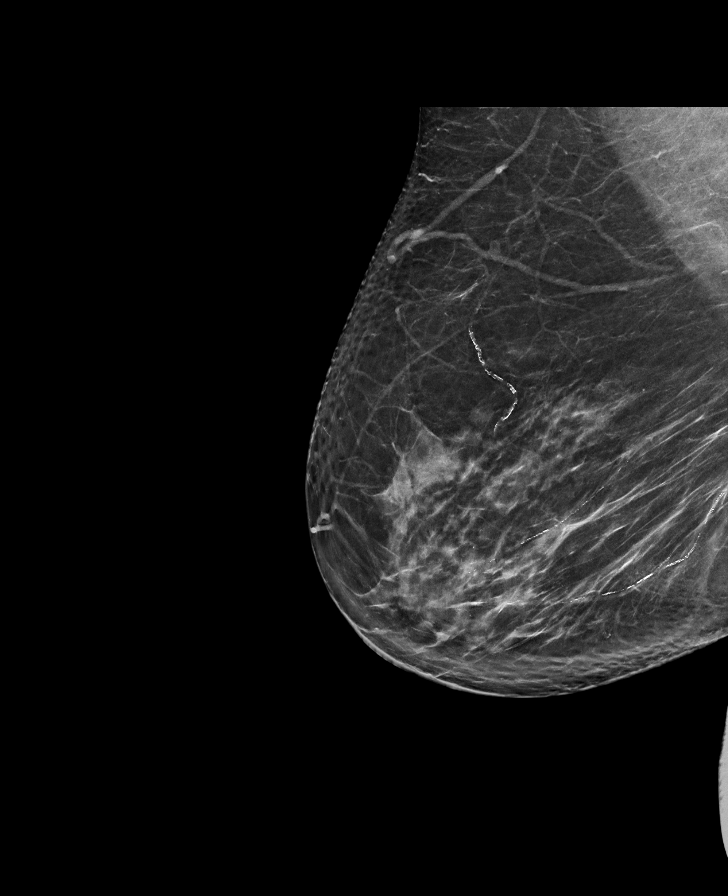

[L MLO tomo · tomo slice 43/84.0]
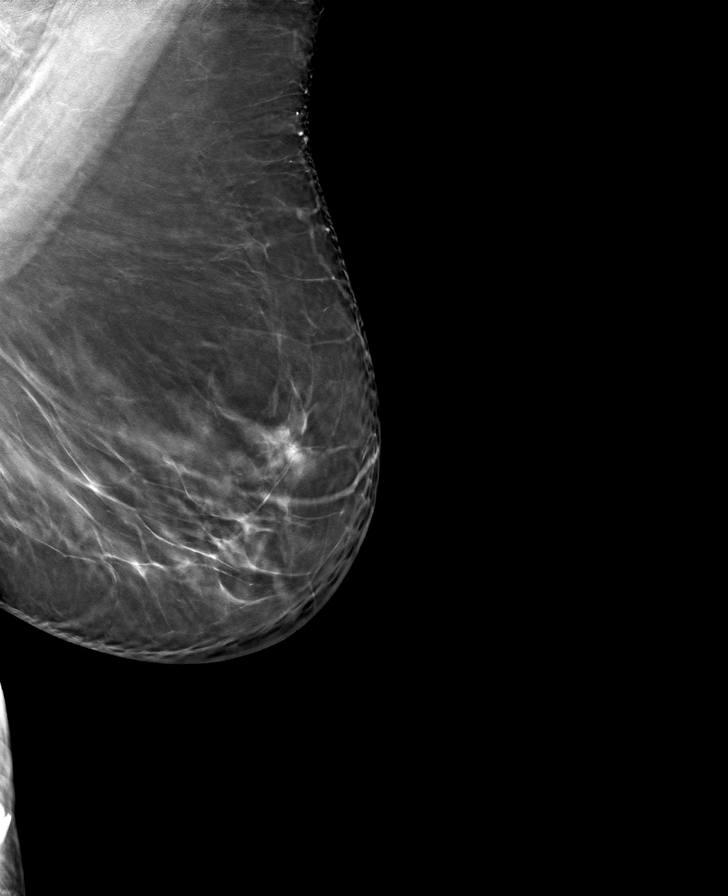

[L CC tomo · tomo slice 36/71.0]
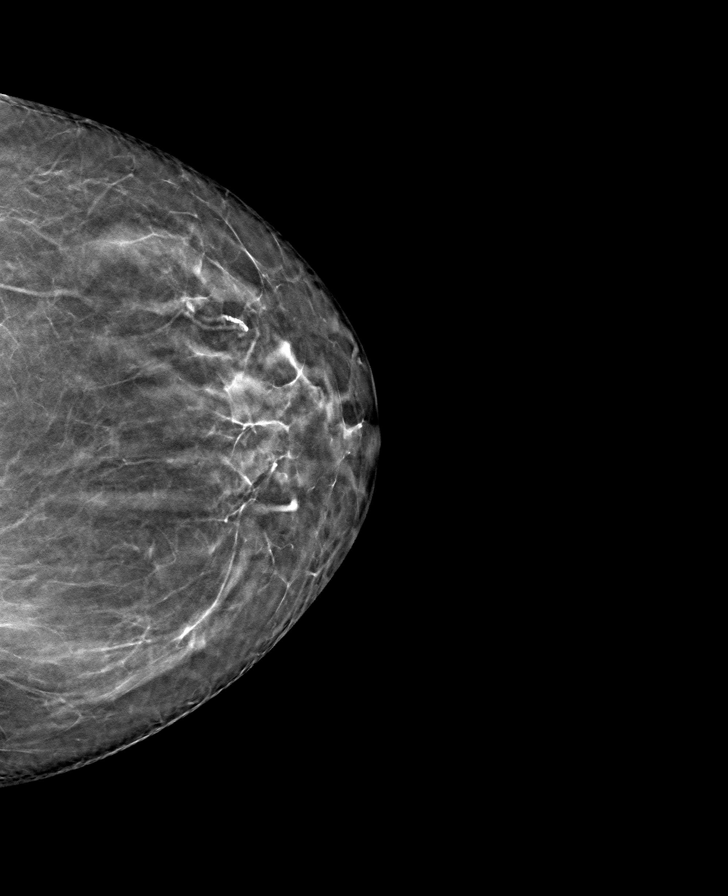

[R CC tomo · tomo slice 35/69.0]
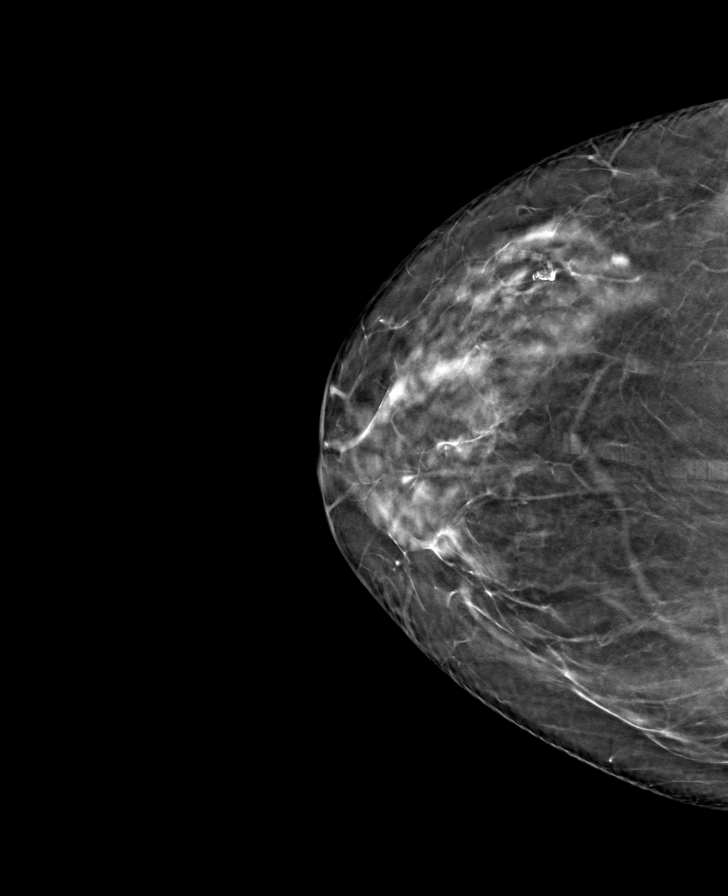

[R MLO tomo · tomo slice 40/79.0]
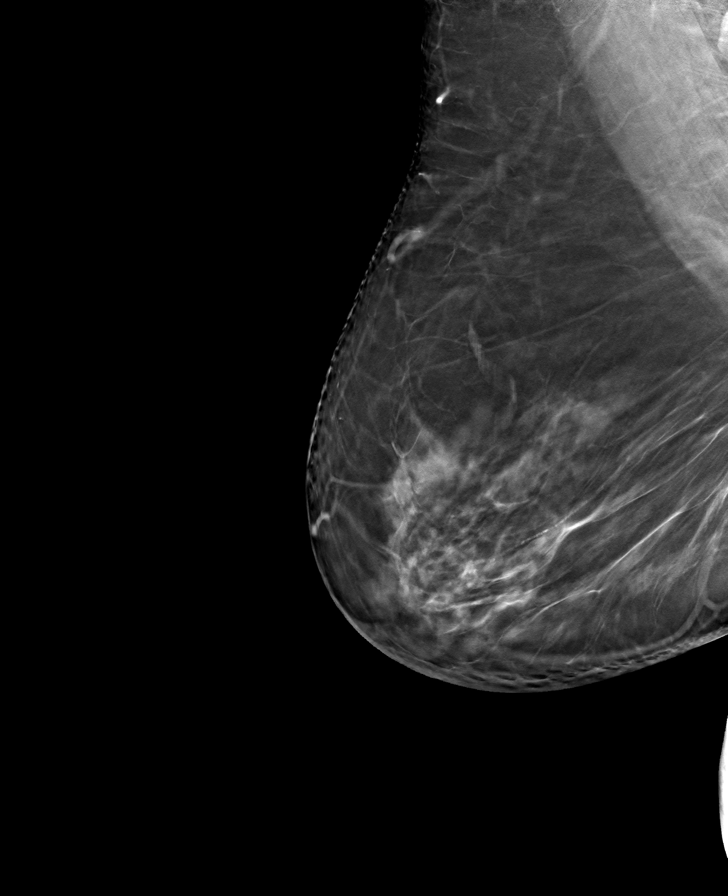

[8 of 24 positions shown; findings below may reference images not displayed]

ACR Breast Density Category b: There are scattered areas of
fibroglandular density.
FINDINGS: There are no findings suspicious for malignancy.
IMPRESSION: No mammographic evidence of malignancy. A result letter of this
screening mammogram will be mailed directly to the patient.

RECOMMENDATION:
Screening mammogram in one year. (Code:51-O-LD2)

BI-RADS CATEGORY  1: Negative.

## 2022-06-16 ENCOUNTER — Other Ambulatory Visit: Payer: Self-pay | Admitting: Family Medicine

## 2022-06-16 DIAGNOSIS — Z1231 Encounter for screening mammogram for malignant neoplasm of breast: Secondary | ICD-10-CM

## 2022-06-21 ENCOUNTER — Ambulatory Visit
Admission: RE | Admit: 2022-06-21 | Discharge: 2022-06-21 | Disposition: A | Discharge status: Home / Self Care | Payer: 59 | Source: Ambulatory Visit | Attending: Family Medicine | Admitting: Family Medicine

## 2022-06-21 DIAGNOSIS — Z1231 Encounter for screening mammogram for malignant neoplasm of breast: Secondary | ICD-10-CM

## 2022-06-23 ENCOUNTER — Other Ambulatory Visit: Payer: Self-pay | Admitting: Family Medicine

## 2022-06-23 DIAGNOSIS — R928 Other abnormal and inconclusive findings on diagnostic imaging of breast: Secondary | ICD-10-CM

## 2022-06-28 ENCOUNTER — Ambulatory Visit
Admission: RE | Admit: 2022-06-28 | Discharge: 2022-06-28 | Disposition: A | Discharge status: Home / Self Care | Payer: 59 | Source: Ambulatory Visit | Attending: Family Medicine | Admitting: Family Medicine

## 2022-06-28 ENCOUNTER — Ambulatory Visit: Payer: 59

## 2022-06-28 DIAGNOSIS — R928 Other abnormal and inconclusive findings on diagnostic imaging of breast: Secondary | ICD-10-CM

## 2022-09-23 NOTE — Progress Notes (Unsigned)
HPI: [name] is a 61 y.o. female, who is here today for her routine physical.  Last CPE: 09/24/21  Regular exercise 3 or more time per week: *** Following a healthy diet: ***  Chronic medical problems: ***  Immunization History  Administered Date(s) Administered   Influenza,inj,Quad PF,6+ Mos 07/31/2019, 08/26/2020   Moderna Sars-Covid-2 Vaccination 08/11/2020   Pneumococcal Polysaccharide-23 07/09/2012   Tdap 05/25/2007, 07/02/2018   Health Maintenance  Topic Date Due   Zoster Vaccines- Shingrix (1 of 2) Never done   INFLUENZA VACCINE  05/17/2022   COVID-19 Vaccine (2 - 2023-24 season) 06/17/2022   HIV Screening  08/18/2024 (Originally 05/27/1976)   PAP SMEAR-Modifier  07/03/2023   MAMMOGRAM  06/21/2024   DTaP/Tdap/Td (3 - Td or Tdap) 07/02/2028   COLONOSCOPY (Pts 45-41yrs Insurance coverage will need to be confirmed)  05/20/2030   Hepatitis C Screening  Completed   HPV VACCINES  Aged Out    She has *** concerns today.  Review of Systems  Current Outpatient Medications on File Prior to Visit  Medication Sig Dispense Refill   albuterol (PROAIR HFA) 108 (90 Base) MCG/ACT inhaler TAKE 2 PUFFS BY MOUTH EVERY 6 HOURS AS NEEDED FOR WHEEZE OR SHORTNESS OF BREATH 18 g 3   aspirin EC 81 MG tablet Take 81 mg by mouth daily.     budesonide-formoterol (SYMBICORT) 80-4.5 MCG/ACT inhaler Inhale 2 puffs into the lungs 2 (two) times daily. 10.2 g 6   cholecalciferol (VITAMIN D) 1000 UNITS tablet Take 1,000 Units by mouth daily.     fish oil-omega-3 fatty acids 1000 MG capsule Take 1 g by mouth daily.     loratadine (CLARITIN) 10 MG tablet Take 10 mg by mouth as needed for allergies.     Spacer/Aero-Holding Chambers (AEROCHAMBER PLUS) inhaler Use as instructed to use with inahaler. 1 each 1   No current facility-administered medications on file prior to visit.    Past Medical History:  Diagnosis Date   Allergy    Asthma     Past Surgical History:   Procedure Laterality Date   CESAREAN SECTION  1990   TUBAL LIGATION  1994    Allergies  Allergen Reactions   Morphine And Related    Latex Rash    Family History  Problem Relation Age of Onset   Heart disease Father 72       CAD   Heart disease Brother        arrhythmias   Heart disease Daughter        CAD   Heart disease Brother 53       CAD   Breast cancer Neg Hx     Social History   Socioeconomic History   Marital status: Married    Spouse name: Not on file   Number of children: Not on file   Years of education: Not on file   Highest education level: Not on file  Occupational History   Not on file  Tobacco Use   Smoking status: Never   Smokeless tobacco: Never  Substance and Sexual Activity   Alcohol use: Yes    Comment: occasionally   Drug use: No   Sexual activity: Not on file  Other Topics Concern   Not on file  Social History Narrative   Not on file   Social Determinants of Health   Financial Resource Strain: Not on file  Food Insecurity: Not on file  Transportation Needs: Not on file  Physical Activity: Not  on file  Stress: Not on file  Social Connections: Not on file    There were no vitals filed for this visit. There is no height or weight on file to calculate BMI.  Wt Readings from Last 3 Encounters:  09/24/21 164 lb 4 oz (74.5 kg)  09/09/20 166 lb 4 oz (75.4 kg)  08/19/19 169 lb 12.8 oz (77 kg)    Physical Exam  ASSESSMENT AND PLAN: Ms. Karen Ellis was here today annual physical examination.  No orders of the defined types were placed in this encounter.   There are no diagnoses linked to this encounter.  There are no diagnoses linked to this encounter.  No follow-ups on file.  Panagiotis Oelkers G. [place], MD  [facility]. [place] office.

## 2022-09-26 ENCOUNTER — Encounter: Payer: Self-pay | Admitting: Family Medicine

## 2022-09-26 ENCOUNTER — Other Ambulatory Visit (HOSPITAL_COMMUNITY)
Admission: RE | Admit: 2022-09-26 | Discharge: 2022-09-26 | Disposition: A | Discharge status: Home / Self Care | Payer: 59 | Source: Ambulatory Visit | Attending: Family Medicine | Admitting: Family Medicine

## 2022-09-26 ENCOUNTER — Ambulatory Visit (INDEPENDENT_AMBULATORY_CARE_PROVIDER_SITE_OTHER): Payer: 59 | Admitting: Family Medicine

## 2022-09-26 VITALS — BP 120/74 | HR 98 | Temp 98.3°F | Resp 16 | Ht 60.0 in | Wt 169.1 lb

## 2022-09-26 DIAGNOSIS — J45901 Unspecified asthma with (acute) exacerbation: Secondary | ICD-10-CM | POA: Diagnosis not present

## 2022-09-26 DIAGNOSIS — Z23 Encounter for immunization: Secondary | ICD-10-CM | POA: Diagnosis not present

## 2022-09-26 DIAGNOSIS — Z0000 Encounter for general adult medical examination without abnormal findings: Secondary | ICD-10-CM

## 2022-09-26 DIAGNOSIS — Z124 Encounter for screening for malignant neoplasm of cervix: Secondary | ICD-10-CM | POA: Insufficient documentation

## 2022-09-26 DIAGNOSIS — R7303 Prediabetes: Secondary | ICD-10-CM

## 2022-09-26 DIAGNOSIS — E7800 Pure hypercholesterolemia, unspecified: Secondary | ICD-10-CM

## 2022-09-26 DIAGNOSIS — Z780 Asymptomatic menopausal state: Secondary | ICD-10-CM

## 2022-09-26 DIAGNOSIS — E041 Nontoxic single thyroid nodule: Secondary | ICD-10-CM

## 2022-09-26 DIAGNOSIS — E559 Vitamin D deficiency, unspecified: Secondary | ICD-10-CM

## 2022-09-26 DIAGNOSIS — J4520 Mild intermittent asthma, uncomplicated: Secondary | ICD-10-CM | POA: Insufficient documentation

## 2022-09-26 LAB — LIPID PANEL
Cholesterol: 224 mg/dL — ABNORMAL HIGH (ref 0–200)
HDL: 65 mg/dL (ref >39.00)
LDL Cholesterol: 130 mg/dL — ABNORMAL HIGH (ref 0–99)
NonHDL: 159.11
Total CHOL/HDL Ratio: 3
Triglycerides: 146 mg/dL (ref 0.0–149.0)
VLDL: 29.2 mg/dL (ref 0.0–40.0)

## 2022-09-26 LAB — BASIC METABOLIC PANEL
BUN: 15 mg/dL (ref 6–23)
CO2: 27 mEq/L (ref 19–32)
Calcium: 9.9 mg/dL (ref 8.4–10.5)
Chloride: 101 mEq/L (ref 96–112)
Creatinine, Ser: 0.75 mg/dL (ref 0.40–1.20)
GFR: 84.78 mL/min (ref >60.00)
Glucose, Bld: 88 mg/dL (ref 70–99)
Potassium: 3.9 mEq/L (ref 3.5–5.1)
Sodium: 137 mEq/L (ref 135–145)

## 2022-09-26 LAB — TSH: TSH: 1.54 u[IU]/mL (ref 0.35–5.50)

## 2022-09-26 LAB — HEMOGLOBIN A1C: Hgb A1c MFr Bld: 6 % (ref 4.6–6.5)

## 2022-09-26 LAB — VITAMIN D 25 HYDROXY (VIT D DEFICIENCY, FRACTURES): VITD: 34.29 ng/mL (ref 30.00–100.00)

## 2022-09-26 MED ORDER — ALBUTEROL SULFATE HFA 108 (90 BASE) MCG/ACT IN AERS: INHALATION_SPRAY | RESPIRATORY_TRACT | 3 refills | Status: DC

## 2022-09-26 NOTE — Assessment & Plan Note (Signed)
Continue Vit D 1000 U daily. Further recommendations according to 25 OH vit D result. 

## 2022-09-26 NOTE — Assessment & Plan Note (Signed)
We discussed the importance of regular physical activity and healthy diet for prevention of chronic illness and/or complications. Preventive guidelines reviewed. Vaccination updated. Pap smear done today. Ca++ and vit D supplementation to continue. Next CPE in a year.

## 2022-09-26 NOTE — Assessment & Plan Note (Signed)
Non pharmacologic treatment recommended for now. Further recommendations will be given according to 10 years CVD risk score and lipid panel numbers. 

## 2022-09-26 NOTE — Assessment & Plan Note (Signed)
Last thyroid US in 2020, no further follow up was recommended. Will continue evaluation TSH annually.

## 2022-09-26 NOTE — Patient Instructions (Addendum)
A few things to remember from today's visit:  Routine general medical examination at a health care facility  Hypercholesterolemia - Plan: Lipid panel  Prediabetes - Plan: Basic metabolic panel, Hemoglobin A1c  Vitamin D deficiency, unspecified - Plan: VITAMIN D 25 Hydroxy (Vit-D Deficiency, Fractures)  Asymptomatic postmenopausal estrogen deficiency - Plan: DEXAScan  Cervical cancer screening - Plan: Cytology - PAP ([place])  Right thyroid nodule - Plan: TSH  If you need refills for medications you take chronically, please call your pharmacy. Do not use My Chart to request refills or for acute issues that need immediate attention. If you send a my chart message, it may take a few days to be addressed, specially if I am not in the office.  Please be sure medication list is accurate. If a new problem present, please set up appointment sooner than planned today.  Health Maintenance, Female Adopting a healthy lifestyle and getting preventive care are important in promoting health and wellness. Ask your health care provider about: The right schedule for you to have regular tests and exams. Things you can do on your own to prevent diseases and keep yourself healthy. What should I know about diet, weight, and exercise? Eat a healthy diet  Eat a diet that includes plenty of vegetables, fruits, low-fat dairy products, and lean protein. Do not eat a lot of foods that are high in solid fats, added sugars, or sodium. Maintain a healthy weight Body mass index (BMI) is used to identify weight problems. It estimates body fat based on height and weight. Your health care provider can help determine your BMI and help you achieve or maintain a healthy weight. Get regular exercise Get regular exercise. This is one of the most important things you can do for your health. Most adults should: Exercise for at least 150 minutes each week. The exercise should increase your heart rate and make you sweat  (moderate-intensity exercise). Do strengthening exercises at least twice a week. This is in addition to the moderate-intensity exercise. Spend less time sitting. Even light physical activity can be beneficial. Watch cholesterol and blood lipids Have your blood tested for lipids and cholesterol at 63 years of age, then have this test every 5 years. Have your cholesterol levels checked more often if: Your lipid or cholesterol levels are high. You are older than 63 years of age. You are at high risk for heart disease. What should I know about cancer screening? Depending on your health history and family history, you may need to have cancer screening at various ages. This may include screening for: Breast cancer. Cervical cancer. Colorectal cancer. Skin cancer. Lung cancer. What should I know about heart disease, diabetes, and high blood pressure? Blood pressure and heart disease High blood pressure causes heart disease and increases the risk of stroke. This is more likely to develop in people who have high blood pressure readings or are overweight. Have your blood pressure checked: Every 3-5 years if you are 7-63 years of age. Every year if you are 68 years old or older. Diabetes Have regular diabetes screenings. This checks your fasting blood sugar level. Have the screening done: Once every three years after age 10 if you are at a normal weight and have a low risk for diabetes. More often and at a younger age if you are overweight or have a high risk for diabetes. What should I know about preventing infection? Hepatitis B If you have a higher risk for hepatitis B, you should be screened  for this virus. Talk with your health care provider to find out if you are at risk for hepatitis B infection. Hepatitis C Testing is recommended for: Everyone born from [birth year] through 1967. Anyone with known risk factors for hepatitis C. Sexually transmitted infections (STIs) Get screened for STIs,  including gonorrhea and chlamydia, if: You are sexually active and are younger than 62 years of age. You are older than 63 years of age and your health care provider tells you that you are at risk for this type of infection. Your sexual activity has changed since you were last screened, and you are at increased risk for chlamydia or gonorrhea. Ask your health care provider if you are at risk. Ask your health care provider about whether you are at high risk for HIV. Your health care provider may recommend a prescription medicine to help prevent HIV infection. If you choose to take medicine to prevent HIV, you should first get tested for HIV. You should then be tested every 3 months for as long as you are taking the medicine. Pregnancy If you are about to stop having your period (premenopausal) and you may become pregnant, seek counseling before you get pregnant. Take 400 to 800 micrograms (mcg) of folic acid every day if you become pregnant. Ask for birth control (contraception) if you want to prevent pregnancy. Osteoporosis and menopause Osteoporosis is a disease in which the bones lose minerals and strength with aging. This can result in bone fractures. If you are 26 years old or older, or if you are at risk for osteoporosis and fractures, ask your health care provider if you should: Be screened for bone loss. Take a calcium or vitamin D supplement to lower your risk of fractures. Be given hormone replacement therapy (HRT) to treat symptoms of menopause. Follow these instructions at home: Alcohol use Do not drink alcohol if: Your health care provider tells you not to drink. You are pregnant, may be pregnant, or are planning to become pregnant. If you drink alcohol: Limit how much you have to: 0-1 drink a day. Know how much alcohol is in your drink. In the [place] one drink equals one 12 oz bottle of beer (355 mL), one 5 oz glass of wine (148 mL), or one 1 oz glass of hard liquor (44  mL). Lifestyle Do not use any products that contain nicotine or tobacco. These products include cigarettes, chewing tobacco, and vaping devices, such as e-cigarettes. If you need help quitting, ask your health care provider. Do not use street drugs. Do not share needles. Ask your health care provider for help if you need support or information about quitting drugs. General instructions Schedule regular health, dental, and eye exams. Stay current with your vaccines. Tell your health care provider if: You often feel depressed. You have ever been abused or do not feel safe at home. Summary Adopting a healthy lifestyle and getting preventive care are important in promoting health and wellness. Follow your health care provider's instructions about healthy diet, exercising, and getting tested or screened for diseases. Follow your health care provider's instructions on monitoring your cholesterol and blood pressure. This information is not intended to replace advice given to you by your health care provider. Make sure you discuss any questions you have with your health care provider. Document Revised: 02/22/2021 Document Reviewed: 02/22/2021 Elsevier Patient Education  2023 [employer].

## 2022-09-26 NOTE — Assessment & Plan Note (Signed)
Problem is well controlled. Continue Albuterol inh 2 puff qid prn.

## 2022-09-28 LAB — CYTOLOGY - PAP
Comment: NEGATIVE
Diagnosis: NEGATIVE
Diagnosis: REACTIVE
High risk HPV: NEGATIVE

## 2023-01-11 ENCOUNTER — Ambulatory Visit (INDEPENDENT_AMBULATORY_CARE_PROVIDER_SITE_OTHER): Payer: 59 | Admitting: *Deleted

## 2023-01-11 DIAGNOSIS — Z23 Encounter for immunization: Secondary | ICD-10-CM

## 2023-01-12 DIAGNOSIS — Z823 Family history of stroke: Secondary | ICD-10-CM | POA: Diagnosis not present

## 2023-01-12 DIAGNOSIS — Z6832 Body mass index (BMI) 32.0-32.9, adult: Secondary | ICD-10-CM | POA: Diagnosis not present

## 2023-01-12 DIAGNOSIS — E669 Obesity, unspecified: Secondary | ICD-10-CM | POA: Diagnosis not present

## 2023-01-12 DIAGNOSIS — E785 Hyperlipidemia, unspecified: Secondary | ICD-10-CM | POA: Diagnosis not present

## 2023-01-12 DIAGNOSIS — Z91040 Latex allergy status: Secondary | ICD-10-CM | POA: Diagnosis not present

## 2023-01-12 DIAGNOSIS — Z885 Allergy status to narcotic agent status: Secondary | ICD-10-CM | POA: Diagnosis not present

## 2023-01-12 DIAGNOSIS — Z8249 Family history of ischemic heart disease and other diseases of the circulatory system: Secondary | ICD-10-CM | POA: Diagnosis not present

## 2023-08-17 ENCOUNTER — Other Ambulatory Visit: Payer: Self-pay | Admitting: Family Medicine

## 2023-08-17 DIAGNOSIS — Z1231 Encounter for screening mammogram for malignant neoplasm of breast: Secondary | ICD-10-CM

## 2023-08-30 ENCOUNTER — Ambulatory Visit
Admission: RE | Admit: 2023-08-30 | Discharge: 2023-08-30 | Disposition: A | Discharge status: Home / Self Care | Payer: 59 | Source: Ambulatory Visit | Attending: Family Medicine | Admitting: Family Medicine

## 2023-08-30 DIAGNOSIS — Z1231 Encounter for screening mammogram for malignant neoplasm of breast: Secondary | ICD-10-CM | POA: Diagnosis not present

## 2023-09-29 ENCOUNTER — Ambulatory Visit (INDEPENDENT_AMBULATORY_CARE_PROVIDER_SITE_OTHER): Payer: 59 | Admitting: Family Medicine

## 2023-09-29 ENCOUNTER — Encounter: Payer: Self-pay | Admitting: Family Medicine

## 2023-09-29 VITALS — BP 120/72 | HR 94 | Temp 98.9°F | Resp 16 | Ht 60.0 in | Wt 163.2 lb

## 2023-09-29 DIAGNOSIS — J4520 Mild intermittent asthma, uncomplicated: Secondary | ICD-10-CM | POA: Diagnosis not present

## 2023-09-29 DIAGNOSIS — Z0000 Encounter for general adult medical examination without abnormal findings: Secondary | ICD-10-CM

## 2023-09-29 DIAGNOSIS — E559 Vitamin D deficiency, unspecified: Secondary | ICD-10-CM | POA: Diagnosis not present

## 2023-09-29 DIAGNOSIS — E7800 Pure hypercholesterolemia, unspecified: Secondary | ICD-10-CM | POA: Diagnosis not present

## 2023-09-29 DIAGNOSIS — R7303 Prediabetes: Secondary | ICD-10-CM | POA: Diagnosis not present

## 2023-09-29 DIAGNOSIS — E041 Nontoxic single thyroid nodule: Secondary | ICD-10-CM

## 2023-09-29 LAB — VITAMIN D 25 HYDROXY (VIT D DEFICIENCY, FRACTURES): VITD: 34.26 ng/mL (ref 30.00–100.00)

## 2023-09-29 LAB — COMPREHENSIVE METABOLIC PANEL
ALT: 20 U/L (ref 0–35)
AST: 23 U/L (ref 0–37)
Albumin: 4.4 g/dL (ref 3.5–5.2)
Alkaline Phosphatase: 65 U/L (ref 39–117)
BUN: 16 mg/dL (ref 6–23)
CO2: 28 meq/L (ref 19–32)
Calcium: 9.7 mg/dL (ref 8.4–10.5)
Chloride: 102 meq/L (ref 96–112)
Creatinine, Ser: 0.72 mg/dL (ref 0.40–1.20)
GFR: 88.41 mL/min (ref >60.00)
Glucose, Bld: 93 mg/dL (ref 70–99)
Potassium: 4.4 meq/L (ref 3.5–5.1)
Sodium: 139 meq/L (ref 135–145)
Total Bilirubin: 0.5 mg/dL (ref 0.2–1.2)
Total Protein: 7.7 g/dL (ref 6.0–8.3)

## 2023-09-29 LAB — TSH: TSH: 0.98 u[IU]/mL (ref 0.35–5.50)

## 2023-09-29 LAB — LIPID PANEL
Cholesterol: 230 mg/dL — ABNORMAL HIGH (ref 0–200)
HDL: 68.2 mg/dL (ref >39.00)
LDL Cholesterol: 135 mg/dL — ABNORMAL HIGH (ref 0–99)
NonHDL: 161.82
Total CHOL/HDL Ratio: 3
Triglycerides: 136 mg/dL (ref 0.0–149.0)
VLDL: 27.2 mg/dL (ref 0.0–40.0)

## 2023-09-29 LAB — HEMOGLOBIN A1C: Hgb A1c MFr Bld: 5.7 % (ref 4.6–6.5)

## 2023-09-29 MED ORDER — BUDESONIDE-FORMOTEROL FUMARATE 80-4.5 MCG/ACT IN AERO: 2.0000 | INHALATION_SPRAY | Freq: Two times a day (BID) | RESPIRATORY_TRACT | 6 refills | Status: DC

## 2023-09-29 NOTE — Assessment & Plan Note (Signed)
Problem has been well-controlled. Continue Symbicort 80-4.5 mcg twice daily as needed.

## 2023-09-29 NOTE — Assessment & Plan Note (Signed)
Continue nonpharmacologic treatment. Further recommendations will be given according to 10 years CVD risk score and lipid panel numbers. 

## 2023-09-29 NOTE — Patient Instructions (Addendum)
A few things to remember from today's visit:  Routine general medical examination at a health care facility  Hypercholesterolemia - Plan: Comprehensive metabolic panel, Lipid panel  Vitamin D deficiency, unspecified - Plan: Comprehensive metabolic panel, VITAMIN D 25 Hydroxy (Vit-D Deficiency, Fractures)  Right thyroid nodule - Plan: TSH  Screening for endocrine, metabolic and immunity disorder  Prediabetes - Plan: Hemoglobin A1c  Do not use My Chart to request refills or for acute issues that need immediate attention. If you send a my chart message, it may take a few days to be addressed, specially if I am not in the office.  Please be sure medication list is accurate. If a new problem present, please set up appointment sooner than planned today.  Health Maintenance, Female Adopting a healthy lifestyle and getting preventive care are important in promoting health and wellness. Ask your health care provider about: The right schedule for you to have regular tests and exams. Things you can do on your own to prevent diseases and keep yourself healthy. What should I know about diet, weight, and exercise? Eat a healthy diet  Eat a diet that includes plenty of vegetables, fruits, low-fat dairy products, and lean protein. Do not eat a lot of foods that are high in solid fats, added sugars, or sodium. Maintain a healthy weight Body mass index (BMI) is used to identify weight problems. It estimates body fat based on height and weight. Your health care provider can help determine your BMI and help you achieve or maintain a healthy weight. Get regular exercise Get regular exercise. This is one of the most important things you can do for your health. Most adults should: Exercise for at least 150 minutes each week. The exercise should increase your heart rate and make you sweat (moderate-intensity exercise). Do strengthening exercises at least twice a week. This is in addition to the  moderate-intensity exercise. Spend less time sitting. Even light physical activity can be beneficial. Watch cholesterol and blood lipids Have your blood tested for lipids and cholesterol at 64 years of age, then have this test every 5 years. Have your cholesterol levels checked more often if: Your lipid or cholesterol levels are high. You are older than 64 years of age. You are at high risk for heart disease. What should I know about cancer screening? Depending on your health history and family history, you may need to have cancer screening at various ages. This may include screening for: Breast cancer. Cervical cancer. Colorectal cancer. Skin cancer. Lung cancer. What should I know about heart disease, diabetes, and high blood pressure? Blood pressure and heart disease High blood pressure causes heart disease and increases the risk of stroke. This is more likely to develop in people who have high blood pressure readings or are overweight. Have your blood pressure checked: Every 3-5 years if you are 6-55 years of age. Every year if you are 37 years old or older. Diabetes Have regular diabetes screenings. This checks your fasting blood sugar level. Have the screening done: Once every three years after age 2 if you are at a normal weight and have a low risk for diabetes. More often and at a younger age if you are overweight or have a high risk for diabetes. What should I know about preventing infection? Hepatitis B If you have a higher risk for hepatitis B, you should be screened for this virus. Talk with your health care provider to find out if you are at risk for hepatitis B  infection. Hepatitis C Testing is recommended for: Everyone born from [birth year] through 1967. Anyone with known risk factors for hepatitis C. Sexually transmitted infections (STIs) Get screened for STIs, including gonorrhea and chlamydia, if: You are sexually active and are younger than 64 years of age. You are  older than 64 years of age and your health care provider tells you that you are at risk for this type of infection. Your sexual activity has changed since you were last screened, and you are at increased risk for chlamydia or gonorrhea. Ask your health care provider if you are at risk. Ask your health care provider about whether you are at high risk for HIV. Your health care provider may recommend a prescription medicine to help prevent HIV infection. If you choose to take medicine to prevent HIV, you should first get tested for HIV. You should then be tested every 3 months for as long as you are taking the medicine. Pregnancy If you are about to stop having your period (premenopausal) and you may become pregnant, seek counseling before you get pregnant. Take 400 to 800 micrograms (mcg) of folic acid every day if you become pregnant. Ask for birth control (contraception) if you want to prevent pregnancy. Osteoporosis and menopause Osteoporosis is a disease in which the bones lose minerals and strength with aging. This can result in bone fractures. If you are 47 years old or older, or if you are at risk for osteoporosis and fractures, ask your health care provider if you should: Be screened for bone loss. Take a calcium or vitamin D supplement to lower your risk of fractures. Be given hormone replacement therapy (HRT) to treat symptoms of menopause. Follow these instructions at home: Alcohol use Do not drink alcohol if: Your health care provider tells you not to drink. You are pregnant, may be pregnant, or are planning to become pregnant. If you drink alcohol: Limit how much you have to: 0-1 drink a day. Know how much alcohol is in your drink. In the [place] one drink equals one 12 oz bottle of beer (355 mL), one 5 oz glass of wine (148 mL), or one 1 oz glass of hard liquor (44 mL). Lifestyle Do not use any products that contain nicotine or tobacco. These products include cigarettes, chewing  tobacco, and vaping devices, such as e-cigarettes. If you need help quitting, ask your health care provider. Do not use street drugs. Do not share needles. Ask your health care provider for help if you need support or information about quitting drugs. General instructions Schedule regular health, dental, and eye exams. Stay current with your vaccines. Tell your health care provider if: You often feel depressed. You have ever been abused or do not feel safe at home. Summary Adopting a healthy lifestyle and getting preventive care are important in promoting health and wellness. Follow your health care provider's instructions about healthy diet, exercising, and getting tested or screened for diseases. Follow your health care provider's instructions on monitoring your cholesterol and blood pressure. This information is not intended to replace advice given to you by your health care provider. Make sure you discuss any questions you have with your health care provider. Document Revised: 02/22/2021 Document Reviewed: 02/22/2021 Elsevier Patient Education  2024 [employer].

## 2023-09-29 NOTE — Assessment & Plan Note (Signed)
We discussed the importance of regular physical activity and healthy diet for prevention of chronic illness and/or complications. Preventive guidelines reviewed. Vaccination up today. Ca++ and vit D supplementation to continue. Next CPE in a year.

## 2023-09-29 NOTE — Assessment & Plan Note (Signed)
Last thyroid US in 2020, no further follow up was recommended. Last TSH 1.5 in 09/2022. TSH ordered today.

## 2023-09-29 NOTE — Progress Notes (Unsigned)
HPI: Karen Ellis is a 62 y.o. female with a PMHx significant for vitamin D deficiency, asthma, right thyroid nodule seasonal allergies, and HLD, who is here today for her routine physical.  Last CPE: 09/26/2022 No new problems since her last visit.  Exercise: Patient states she goes to the gym 3x per week and does cardio and weight work.  Diet: She is still cooking at home and trying to avoid fried foods most of the time.  Sleep: 7 hours per night Alcohol Use: She occasionally has a glass of wine on special occasions.  Smoking: never Vision: UTD on routine vision care.  Dental: UTD on routine dental care.   Immunization History  Administered Date(s) Administered  . Influenza Whole 08/16/2022  . Influenza,inj,Quad PF,6+ Mos 07/31/2019, 08/26/2020  . Influenza-Unspecified 07/06/2023  . Moderna Covid-19 Fall Seasonal Vaccine 49yrs & older 07/06/2023  . Moderna Sars-Covid-2 Vaccination 08/11/2020  . Pneumococcal Polysaccharide-23 07/09/2012  . Tdap 05/25/2007, 07/02/2018  . Zoster Recombinant(Shingrix) 09/26/2022, 01/11/2023   Health Maintenance  Topic Date Due  . HIV Screening  08/18/2024 (Originally 05/27/1976)  . MAMMOGRAM  08/29/2025  . Cervical Cancer Screening (HPV/Pap Cotest)  09/27/2027  . DTaP/Tdap/Td (3 - Td or Tdap) 07/02/2028  . Colonoscopy  05/20/2030  . INFLUENZA VACCINE  Completed  . COVID-19 Vaccine  Completed  . Hepatitis C Screening  Completed  . Zoster Vaccines- Shingrix  Completed  . HPV VACCINES  Aged Out   Chronic medical problems:   Hyperlipidemia:  Not currently on pharmacologic treatment.  Lab Results  Component Value Date   CHOL 224 (H) 09/26/2022   HDL 65.00 09/26/2022   LDLCALC 130 (H) 09/26/2022   TRIG 146.0 09/26/2022   CHOLHDL 3 09/26/2022   Asthma:  She says her asthma has been well controlled this year.  She still has her albuterol and Symbicort inhalers, but hasn't used them for awhile.   -Right sub centimeter  thyroid nodule seen on thyroid US in 2020, no further follow up was recommended. Lab Results  Component Value Date   TSH 1.54 09/26/2022   Prediabetes: Negative for polydipsia, polyphagia, or polyuria. Lab Results  Component Value Date   HGBA1C 6.0 09/26/2022   Review of Systems  Constitutional:  Negative for activity change, appetite change and fever.  HENT:  Negative for hearing loss, mouth sores, sore throat and trouble swallowing.   Eyes:  Negative for redness and visual disturbance.  Respiratory:  Negative for cough, shortness of breath and wheezing.   Cardiovascular:  Negative for chest pain and leg swelling.  Gastrointestinal:  Positive for vomiting. Negative for abdominal pain and nausea.       No changes in bowel habits.  Endocrine: Negative for cold intolerance, heat intolerance, polydipsia, polyphagia and polyuria.  Genitourinary:  Negative for decreased urine volume, dysuria, hematuria, vaginal bleeding and vaginal discharge.  Musculoskeletal:  Negative for gait problem and myalgias.  Skin:  Negative for color change and rash.  Allergic/Immunologic: Positive for environmental allergies.  Neurological:  Negative for seizures, syncope, weakness and headaches.  Hematological:  Negative for adenopathy. Does not bruise/bleed easily.  Psychiatric/Behavioral:  Negative for confusion. The patient is not nervous/anxious.   All other systems reviewed and are negative.  Current Outpatient Medications on File Prior to Visit  Medication Sig Dispense Refill  . albuterol (PROAIR HFA) 108 (90 Base) MCG/ACT inhaler TAKE 2 PUFFS BY MOUTH EVERY 6 HOURS AS NEEDED FOR WHEEZE OR SHORTNESS OF BREATH 18 g 3  . aspirin  EC 81 MG tablet Take 81 mg by mouth daily.    . cholecalciferol (VITAMIN D) 1000 UNITS tablet Take 1,000 Units by mouth daily.    . fish oil-omega-3 fatty acids 1000 MG capsule Take 1 g by mouth daily.    Marland Kitchen loratadine (CLARITIN) 10 MG tablet Take 10 mg by mouth as needed for  allergies.    Marland Kitchen Spacer/Aero-Holding Chambers (AEROCHAMBER PLUS) inhaler Use as instructed to use with inahaler. 1 each 1   No current facility-administered medications on file prior to visit.   Past Medical History:  Diagnosis Date  . Allergy   . Asthma    Past Surgical History:  Procedure Laterality Date  . CESAREAN SECTION  1990  . TUBAL LIGATION  1994   Allergies  Allergen Reactions  . Morphine And Codeine   . Latex Rash   Family History  Problem Relation Age of Onset  . Heart disease Father 64       CAD  . Heart disease Daughter        CAD  . Heart disease Brother        arrhythmias  . Heart disease Brother 12       CAD  . Breast cancer Neg Hx   . BRCA 1/2 Neg Hx     Social History   Socioeconomic History  . Marital status: Married    Spouse name: Not on file  . Number of children: Not on file  . Years of education: Not on file  . Highest education level: Not on file  Occupational History  . Not on file  Tobacco Use  . Smoking status: Never  . Smokeless tobacco: Never  Substance and Sexual Activity  . Alcohol use: Yes    Comment: occasionally  . Drug use: No  . Sexual activity: Not on file  Other Topics Concern  . Not on file  Social History Narrative  . Not on file   Social Drivers of Health   Financial Resource Strain: Not on file  Food Insecurity: Not on file  Transportation Needs: Not on file  Physical Activity: Not on file  Stress: Not on file  Social Connections: Unknown (02/26/2022)   Received from [facility], [facility]   Social Network   . Social Network: Not on file   Vitals:   09/29/23 0956  BP: 120/72  Pulse: 94  Resp: 16  Temp: 98.9 F (37.2 C)  SpO2: 98%   Body mass index is 31.88 kg/m.  Wt Readings from Last 3 Encounters:  09/29/23 163 lb 4 oz (74 kg)  09/26/22 169 lb 2 oz (76.7 kg)  09/24/21 164 lb 4 oz (74.5 kg)   Physical Exam Vitals and nursing note reviewed.  Constitutional:      General: She is not  in acute distress.    Appearance: She is well-developed.  HENT:     Head: Normocephalic and atraumatic.     Right Ear: Tympanic membrane, ear canal and external ear normal.     Left Ear: Tympanic membrane, ear canal and external ear normal.     Mouth/Throat:     Mouth: Mucous membranes are moist.     Pharynx: Oropharynx is clear. Uvula midline.  Eyes:     Extraocular Movements: Extraocular movements intact.     Conjunctiva/sclera: Conjunctivae normal.     Pupils: Pupils are equal, round, and reactive to light.  Neck:     Thyroid: No thyromegaly (Right side nodule).  Cardiovascular:  Rate and Rhythm: Normal rate and regular rhythm.     Pulses:          Dorsalis pedis pulses are 2+ on the right side and 2+ on the left side.     Heart sounds: No murmur heard. Pulmonary:     Effort: Pulmonary effort is normal. No respiratory distress.     Breath sounds: Normal breath sounds.  Abdominal:     Palpations: Abdomen is soft. There is no hepatomegaly or mass.     Tenderness: There is no abdominal tenderness.  Genitourinary:    Comments: No concerns today. Musculoskeletal:     Right lower leg: No edema.     Left lower leg: No edema.     Comments: No signs of synovitis appreciated.  Lymphadenopathy:     Cervical: No cervical adenopathy.     Upper Body:     Right upper body: No supraclavicular adenopathy.     Left upper body: No supraclavicular adenopathy.  Skin:    General: Skin is warm.     Findings: No erythema or rash.  Neurological:     General: No focal deficit present.     Mental Status: She is alert and oriented to person, place, and time.     Cranial Nerves: No cranial nerve deficit.     Coordination: Coordination normal.     Gait: Gait normal.     Deep Tendon Reflexes:     Reflex Scores:      Bicep reflexes are 2+ on the right side and 2+ on the left side.      Patellar reflexes are 2+ on the right side and 2+ on the left side. Psychiatric:        Mood and Affect:  Mood and affect normal.  ASSESSMENT AND PLAN:  Ms. Ariceli Stobaugh was here today for her annual physical examination.  Orders Placed This Encounter  Procedures  . Comprehensive metabolic panel  . Hemoglobin A1c  . Lipid panel  . VITAMIN D 25 Hydroxy (Vit-D Deficiency, Fractures)  . TSH   Lab Results  Component Value Date   TSH 0.98 09/29/2023   Lab Results  Component Value Date   NA 139 09/29/2023   CL 102 09/29/2023   K 4.4 09/29/2023   CO2 28 09/29/2023   BUN 16 09/29/2023   CREATININE 0.72 09/29/2023   GFR 88.41 09/29/2023   CALCIUM 9.7 09/29/2023   ALBUMIN 4.4 09/29/2023   GLUCOSE 93 09/29/2023   Lab Results  Component Value Date   ALT 20 09/29/2023   AST 23 09/29/2023   ALKPHOS 65 09/29/2023   BILITOT 0.5 09/29/2023   Lab Results  Component Value Date   VD25OH 34.26 09/29/2023   Lab Results  Component Value Date   CHOL 230 (H) 09/29/2023   HDL 68.20 09/29/2023   LDLCALC 135 (H) 09/29/2023   TRIG 136.0 09/29/2023   CHOLHDL 3 09/29/2023   Lab Results  Component Value Date   HGBA1C 5.7 09/29/2023   Routine general medical examination at a health care facility Assessment & Plan: We discussed the importance of regular physical activity and healthy diet for prevention of chronic illness and/or complications. Preventive guidelines reviewed. Vaccination up today. Ca++ and vit D supplementation to continue. Next CPE in a year.  The 10-year ASCVD risk score (Arnett DK, et al., 2019) is: 4.3%   Values used to calculate the score:     Age: 28 years     Sex: Female  Is Non-Hispanic African American: No     Diabetic: No     Tobacco smoker: No     Systolic Blood Pressure: 120 mmHg     Is BP treated: No     HDL Cholesterol: 68.2 mg/dL     Total Cholesterol: 230 mg/dL  Hypercholesterolemia Assessment & Plan: Continue nonpharmacologic treatment. Further recommendations will be given according to 10 years CVD risk score and lipid panel  numbers.  Orders: -     Comprehensive metabolic panel; Future -     Lipid panel; Future  Vitamin D deficiency, unspecified Assessment & Plan: Continue current dose of vitamin D supplementation. Further recommendation will be given according to 25 OH vitamin D result.  Orders: -     Comprehensive metabolic panel; Future -     VITAMIN D 25 Hydroxy (Vit-D Deficiency, Fractures); Future  Right thyroid nodule Assessment & Plan: Last thyroid US in 2020, no further follow up was recommended. Last TSH 1.5 in 09/2022. TSH ordered today.  Orders: -     TSH; Future  Prediabetes Last hemoglobin A1c 6.0 in 09/2022. Encourage consistency with a healthy lifestyle for diabetes prevention. Further recommendation will be given according to hemoglobin A1c result.  -     Hemoglobin A1c; Future  Asthma in adult, mild intermittent, uncomplicated Assessment & Plan: Problem has been well-controlled. Continue Symbicort 80-4.5 mcg twice daily as needed.  Orders: -     Budesonide-Formoterol Fumarate; Inhale 2 puffs into the lungs 2 (two) times daily.  Dispense: 10.2 g; Refill: 6  Return in 1 year (on 09/28/2024).  I, Rolla Etienne Wierda, acting as a scribe for Corderius Saraceni [place], MD., have documented all relevant documentation on the behalf of Ulis Kaps [place], MD, as directed by  Burtis Imhoff [place], MD while in the presence of Zeynab Klett [place], MD.   I, Tessa Seaberry [place], MD, have reviewed all documentation for this visit. The documentation on 09/29/23 for the exam, diagnosis, procedures, and orders are all accurate and complete.  Mahamadou Weltz G. [place], MD  [facility]. [place] office.

## 2023-09-29 NOTE — Assessment & Plan Note (Signed)
Continue current dose of vitamin D supplementation. Further recommendation will be given according to 25 OH vitamin D result. 

## 2024-01-25 ENCOUNTER — Encounter: Payer: Self-pay | Admitting: Internal Medicine

## 2024-01-25 ENCOUNTER — Ambulatory Visit (INDEPENDENT_AMBULATORY_CARE_PROVIDER_SITE_OTHER): Admitting: Internal Medicine

## 2024-01-25 VITALS — BP 120/70 | HR 130 | Temp 102.4°F

## 2024-01-25 DIAGNOSIS — N3001 Acute cystitis with hematuria: Secondary | ICD-10-CM

## 2024-01-25 LAB — POC URINALSYSI DIPSTICK (AUTOMATED)
Bilirubin, UA: NEGATIVE
Glucose, UA: NEGATIVE
Nitrite, UA: POSITIVE
Protein, UA: POSITIVE — AB
Spec Grav, UA: 1.015 (ref 1.010–1.025)
Urobilinogen, UA: 0.2 U/dL
pH, UA: 6.5 (ref 5.0–8.0)

## 2024-01-25 MED ORDER — SULFAMETHOXAZOLE-TRIMETHOPRIM 800-160 MG PO TABS: 1.0000 | ORAL_TABLET | Freq: Two times a day (BID) | ORAL | 0 refills | Status: AC

## 2024-01-25 NOTE — Progress Notes (Signed)
 Established Patient Office Visit     CC/Reason for Visit: "I feel bad"  HPI: Karen Ellis is a 63 y.o. female who is coming in today for the above mentioned reasons.  2 days ago she started noticing suprapubic pain and dysuria with increased frequency.  T today she woke up with fever and chills.  In the office today her temperature is 102.4 and a heart rate of 130.  No nausea or vomiting.  She noticed dark-colored urine in the toilet today.   Past Medical/Surgical History: Past Medical History:  Diagnosis Date   Allergy    Asthma     Past Surgical History:  Procedure Laterality Date   CESAREAN SECTION  1990   TUBAL LIGATION  1994    Social History:  reports that she has never smoked. She has never used smokeless tobacco. She reports current alcohol use. She reports that she does not use drugs.  Allergies: Allergies  Allergen Reactions   Morphine And Codeine    Latex Rash    Family History:  Family History  Problem Relation Age of Onset   Heart disease Father 39       CAD   Heart disease Daughter        CAD   Heart disease Brother        arrhythmias   Heart disease Brother 56       CAD   Breast cancer Neg Hx    BRCA 1/2 Neg Hx      Current Outpatient Medications:    albuterol (PROAIR HFA) 108 (90 Base) MCG/ACT inhaler, TAKE 2 PUFFS BY MOUTH EVERY 6 HOURS AS NEEDED FOR WHEEZE OR SHORTNESS OF BREATH, Disp: 18 g, Rfl: 3   aspirin EC 81 MG tablet, Take 81 mg by mouth daily., Disp: , Rfl:    budesonide-formoterol (SYMBICORT) 80-4.5 MCG/ACT inhaler, Inhale 2 puffs into the lungs 2 (two) times daily., Disp: 10.2 g, Rfl: 6   cholecalciferol (VITAMIN D) 1000 UNITS tablet, Take 1,000 Units by mouth daily., Disp: , Rfl:    fish oil-omega-3 fatty acids 1000 MG capsule, Take 1 g by mouth daily., Disp: , Rfl:    loratadine (CLARITIN) 10 MG tablet, Take 10 mg by mouth as needed for allergies., Disp: , Rfl:    Spacer/Aero-Holding Chambers (AEROCHAMBER PLUS)  inhaler, Use as instructed to use with inahaler., Disp: 1 each, Rfl: 1   sulfamethoxazole-trimethoprim (BACTRIM DS) 800-160 MG tablet, Take 1 tablet by mouth 2 (two) times daily for 7 days., Disp: 14 tablet, Rfl: 0  Review of Systems:  Negative unless indicated in HPI.   Physical Exam: Vitals:   01/25/24 1614  BP: 120/70  Pulse: (!) 130  Temp: (!) 102.4 F (39.1 C)  TempSrc: Oral  SpO2: 98%    There is no height or weight on file to calculate BMI.    Impression and Plan:  Acute cystitis with hematuria -     POCT Urinalysis Dipstick (Automated) -     Urinalysis; Future -     Sulfamethoxazole-Trimethoprim; Take 1 tablet by mouth 2 (two) times daily for 7 days.  Dispense: 14 tablet; Refill: 0   -Treat with 7 days of Bactrim.  Advised to rotate Tylenol and ibuprofen for fever management.  If not better in 48 hours or vomiting have advised ED evaluation.  Time spent:30 minutes reviewing chart, interviewing and examining patient and formulating plan of care.     Chaya Jan, MD [facility]  at [facility]

## 2024-01-26 LAB — URINALYSIS, ROUTINE W REFLEX MICROSCOPIC
Bilirubin Urine: NEGATIVE
Ketones, ur: 15 — AB
Nitrite: NEGATIVE
Specific Gravity, Urine: 1.015 (ref 1.000–1.030)
Total Protein, Urine: 100 — AB
Urine Glucose: NEGATIVE
Urobilinogen, UA: 0.2 (ref 0.0–1.0)
pH: 6.5 (ref 5.0–8.0)

## 2024-03-12 ENCOUNTER — Ambulatory Visit: Admitting: Family Medicine

## 2024-03-12 ENCOUNTER — Encounter: Payer: Self-pay | Admitting: Family Medicine

## 2024-03-12 ENCOUNTER — Other Ambulatory Visit: Payer: Self-pay | Admitting: Family Medicine

## 2024-03-12 VITALS — BP 118/82 | HR 92 | Temp 98.4°F | Wt 165.4 lb

## 2024-03-12 DIAGNOSIS — J45901 Unspecified asthma with (acute) exacerbation: Secondary | ICD-10-CM

## 2024-03-12 DIAGNOSIS — J40 Bronchitis, not specified as acute or chronic: Secondary | ICD-10-CM

## 2024-03-12 MED ORDER — AZITHROMYCIN 250 MG PO TABS: ORAL_TABLET | ORAL | 0 refills | Status: DC

## 2024-03-12 MED ORDER — ALBUTEROL SULFATE HFA 108 (90 BASE) MCG/ACT IN AERS: 2.0000 | INHALATION_SPRAY | RESPIRATORY_TRACT | 0 refills | Status: DC | PRN

## 2024-03-12 NOTE — Progress Notes (Signed)
   Subjective:    Patient ID: Karen Ellis, female    DOB: 26-Jul-1961, 63 y.o.   MRN: [id number]  HPI Here for 5 days of stuffy head and coughing up yellow sputum. No fever.    Review of Systems  Constitutional: Negative.   HENT:  Positive for congestion. Negative for ear pain, postnasal drip, sinus pressure and sore throat.   Eyes: Negative.   Respiratory:  Positive for cough. Negative for shortness of breath and wheezing.        Objective:   Physical Exam Constitutional:      Appearance: Normal appearance.  HENT:     Right Ear: Tympanic membrane, ear canal and external ear normal.     Left Ear: Tympanic membrane, ear canal and external ear normal.     Nose: Nose normal.     Mouth/Throat:     Pharynx: Oropharynx is clear.  Eyes:     Conjunctiva/sclera: Conjunctivae normal.  Pulmonary:     Effort: Pulmonary effort is normal.     Breath sounds: Rhonchi present. No wheezing or rales.  Lymphadenopathy:     Cervical: No cervical adenopathy.  Neurological:     Mental Status: She is alert.           Assessment & Plan:  Bronchitis. Treat with a Zpack.  Corita Diego, MD

## 2024-04-11 ENCOUNTER — Other Ambulatory Visit: Payer: Self-pay | Admitting: Family Medicine

## 2024-04-11 DIAGNOSIS — J45901 Unspecified asthma with (acute) exacerbation: Secondary | ICD-10-CM

## 2024-05-30 DIAGNOSIS — Z860101 Personal history of adenomatous and serrated colon polyps: Secondary | ICD-10-CM | POA: Diagnosis not present

## 2024-05-30 DIAGNOSIS — D125 Benign neoplasm of sigmoid colon: Secondary | ICD-10-CM | POA: Diagnosis not present

## 2024-05-30 DIAGNOSIS — Z1211 Encounter for screening for malignant neoplasm of colon: Secondary | ICD-10-CM | POA: Diagnosis not present

## 2024-05-30 DIAGNOSIS — D120 Benign neoplasm of cecum: Secondary | ICD-10-CM | POA: Diagnosis not present

## 2024-06-25 ENCOUNTER — Other Ambulatory Visit: Payer: Self-pay | Admitting: Family Medicine

## 2024-06-25 ENCOUNTER — Encounter: Payer: Self-pay | Admitting: Family Medicine

## 2024-06-25 DIAGNOSIS — Z1231 Encounter for screening mammogram for malignant neoplasm of breast: Secondary | ICD-10-CM

## 2024-08-05 ENCOUNTER — Ambulatory Visit: Payer: Self-pay

## 2024-08-05 NOTE — Telephone Encounter (Signed)
 Patient reports  lump on her tongue for an unknown amount of time. Patient reports mild pain with no other symptoms. Requested an appointment with PCP. Scheduled for tomorrow for acute visit at 10 AM.  FYI Only or Action Required?: FYI only for provider.  Patient was last seen in primary care on 03/12/2024 by Johnny Garnette LABOR, MD.  Called Nurse Triage reporting Tongue Pain.  Symptoms began unknown amount of time.  Interventions attempted: Rest, hydration, or home remedies.  Symptoms are: unchanged.  Triage Disposition: See PCP When Office is Open (Within 3 Days)  Patient/caregiver understands and will follow disposition?: Yes  Copied from CRM #[id number]. Topic: Clinical - Red Word Triage >> Aug 05, 2024 11:12 AM Suzen RAMAN wrote: Red Word that prompted transfer to Nurse Triage: something on Tongue sometimes painful, requesting an appt Reason for Disposition  Weak immune system (e.g., HIV positive, cancer chemo, splenectomy, organ transplant, chronic steroids)  Answer Assessment - Initial Assessment Questions 1. SYMPTOM: What's the main symptom you're concerned about? (e.g., chapped lips, dry mouth, lump, sores)     Lump on tongue 2. ONSET: When did the  lump on tongue  start?     unsure 3. PAIN: Is there any pain? If Yes, ask: How bad is it? (Scale: 0-10; or none, mild, moderate, severe)     mild 4. CAUSE: What do you think is causing the symptoms?     unsure 5. OTHER SYMPTOMS: Do you have any other symptoms? (e.g., fever, sore throat, toothache, swelling)     no  Protocols used: Mouth Symptoms-A-AH

## 2024-08-06 ENCOUNTER — Encounter: Payer: Self-pay | Admitting: Family Medicine

## 2024-08-06 ENCOUNTER — Ambulatory Visit: Admitting: Family Medicine

## 2024-08-06 VITALS — BP 124/70 | HR 76 | Temp 97.9°F | Resp 16 | Ht 60.0 in | Wt 170.6 lb

## 2024-08-06 DIAGNOSIS — B079 Viral wart, unspecified: Secondary | ICD-10-CM | POA: Diagnosis not present

## 2024-08-06 NOTE — Progress Notes (Unsigned)
 ACUTE VISIT Chief Complaint  Patient presents with   Acute Visit    Left thumb pain, swelling and bump onset one week ago    HPI: [name] is a 63 y.o. female, who is here today complaining of *** HPI Discussed the use of AI scribe software for clinical note transcription with the patient, who gave verbal consent to proceed.  History of Present Illness Karen Ellis is a 63 year old female who presents with left thumb pain and swelling.  She has been experiencing pain and swelling in her left thumb for approximately three weeks. Initially, she noticed a blister-like formation on the thumb, which has since grown and become painful, particularly when pressure is applied, such as when grabbing objects. She rates the pain as a 5 out of 10. The pain sometimes radiates to the joint, causing discomfort when bending the thumb.  She denies any known injury or splinter in the area and has not experienced fever or chills. There is occasional redness. She has attempted self-treatment with over-the-counter topical analgesics like Icy Hot or Aspercreme, and cortisone cream, but these have not provided relief. She has not used warm compresses or other treatments.  She is right-handed and engages in activities such as painting, which may involve using her left hand for holding objects. No fever, chills, or other joint pain aside from the left thumb.   Review of Systems See other pertinent positives and negatives in HPI.  Current Outpatient Medications on File Prior to Visit  Medication Sig Dispense Refill   albuterol  (VENTOLIN  HFA) 108 (90 Base) MCG/ACT inhaler INHALE 2 PUFFS INTO THE LUNGS EVERY 4-6 HOURS AS NEEDED FOR WHEEZING/SHORTNESS OF BREATH. 6.7 each 2   aspirin EC 81 MG tablet Take 81 mg by mouth daily.     azithromycin  (ZITHROMAX  Z-PAK) 250 MG tablet As directed 6 each 0   budesonide -formoterol  (SYMBICORT ) 80-4.5 MCG/ACT inhaler Inhale 2 puffs into the lungs  2 (two) times daily. 10.2 g 6   cholecalciferol (VITAMIN D ) 1000 UNITS tablet Take 1,000 Units by mouth daily.     fish oil-omega-3 fatty acids 1000 MG capsule Take 1 g by mouth daily.     Spacer/Aero-Holding Chambers (AEROCHAMBER PLUS) inhaler Use as instructed to use with inahaler. 1 each 1   loratadine (CLARITIN) 10 MG tablet Take 10 mg by mouth as needed for allergies.     No current facility-administered medications on file prior to visit.    Past Medical History:  Diagnosis Date   Allergy    Asthma    Allergies  Allergen Reactions   Morphine  And Codeine    Latex Rash    Social History   Socioeconomic History   Marital status: Married    Spouse name: Not on file   Number of children: Not on file   Years of education: Not on file   Highest education level: Not on file  Occupational History   Not on file  Tobacco Use   Smoking status: Never   Smokeless tobacco: Never  Substance and Sexual Activity   Alcohol use: Yes    Comment: occasionally   Drug use: No   Sexual activity: Not on file  Other Topics Concern   Not on file  Social History Narrative   Not on file   Social Drivers of Health   Financial Resource Strain: Not on file  Food Insecurity: Not on file  Transportation Needs: Not on file  Physical Activity: Not on  file  Stress: Not on file  Social Connections: Unknown (02/26/2022)   Received from [facility]   Social Network    Social Network: Not on file    Vitals:   08/06/24 0947  BP: 124/70  Pulse: 76  Resp: 16  Temp: 97.9 F (36.6 C)  SpO2: 99%   Body mass index is 33.32 kg/m.  Physical Exam Vitals and nursing note reviewed.  Constitutional:      General: She is not in acute distress.    Appearance: She is well-developed.  HENT:     Head: Normocephalic and atraumatic.  Eyes:     Conjunctiva/sclera: Conjunctivae normal.  Pulmonary:     Effort: Pulmonary effort is normal. No respiratory distress.  Lymphadenopathy:     Cervical:  No cervical adenopathy.  Skin:    General: Skin is warm.     Findings: Lesion present. No rash.     Comments: Left thumb callous like lesion, defined borders, not erythematous and not tender with light touch. See picture.***  Neurological:     Mental Status: She is alert and oriented to person, place, and time.  Psychiatric:        Speech: Speech normal.     Comments: Well groomed, good eye contact.     ASSESSMENT AND PLAN:   Viral wart on finger  We discussed differential diagnosis. After discussing treatment options, she would like treatment with liquid nitrogen, we discussed procedure and she gave verbal consent to proceed. She tolerated procedure well. Postprocedure instructions given. After healing, she could use OTC medications or schedule a follow-up appointment to repeat treatment in 3 to 4 weeks.   Return if symptoms worsen or fail to improve, for keep next appointment.  Jakerria Kingbird G. [place], MD  [facility]. [place] office.

## 2024-08-06 NOTE — Patient Instructions (Addendum)
 A few things to remember from today's visit:  Viral wart on finger After recovery you can use over-the-counter treatments like Compound W or you can schedule a follow-up appointment in 3 to 4 weeks.  If you need refills for medications you take chronically, please call your pharmacy. Do not use My Chart to request refills or for acute issues that need immediate attention. If you send a my chart message, it may take a few days to be addressed, specially if I am not in the office.  Please be sure medication list is accurate. If a new problem present, please set up appointment sooner than planned today.

## 2024-08-30 ENCOUNTER — Ambulatory Visit
Admission: RE | Admit: 2024-08-30 | Discharge: 2024-08-30 | Disposition: A | Discharge status: Home / Self Care | Source: Ambulatory Visit | Attending: Family Medicine | Admitting: Family Medicine

## 2024-08-30 DIAGNOSIS — Z1231 Encounter for screening mammogram for malignant neoplasm of breast: Secondary | ICD-10-CM

## 2024-10-01 ENCOUNTER — Encounter: Admitting: Family Medicine

## 2024-10-01 ENCOUNTER — Ambulatory Visit: Admitting: Family Medicine

## 2024-10-01 ENCOUNTER — Ambulatory Visit: Payer: Self-pay | Admitting: Family Medicine

## 2024-10-01 ENCOUNTER — Encounter: Payer: Self-pay | Admitting: Family Medicine

## 2024-10-01 VITALS — BP 118/76 | HR 88 | Temp 98.0°F | Resp 16 | Ht 60.0 in | Wt 171.0 lb

## 2024-10-01 DIAGNOSIS — R7303 Prediabetes: Secondary | ICD-10-CM | POA: Insufficient documentation

## 2024-10-01 DIAGNOSIS — J4520 Mild intermittent asthma, uncomplicated: Secondary | ICD-10-CM

## 2024-10-01 DIAGNOSIS — E7800 Pure hypercholesterolemia, unspecified: Secondary | ICD-10-CM

## 2024-10-01 DIAGNOSIS — E559 Vitamin D deficiency, unspecified: Secondary | ICD-10-CM

## 2024-10-01 DIAGNOSIS — E041 Nontoxic single thyroid nodule: Secondary | ICD-10-CM

## 2024-10-01 DIAGNOSIS — Z0000 Encounter for general adult medical examination without abnormal findings: Secondary | ICD-10-CM

## 2024-10-01 DIAGNOSIS — Z23 Encounter for immunization: Secondary | ICD-10-CM

## 2024-10-01 LAB — LIPID PANEL
Cholesterol: 239 mg/dL — ABNORMAL HIGH (ref 28–200)
HDL: 65.4 mg/dL (ref >39.00)
LDL Cholesterol: 145 mg/dL — ABNORMAL HIGH (ref 0–99)
NonHDL: 173.89
Total CHOL/HDL Ratio: 4
Triglycerides: 146 mg/dL (ref 0.0–149.0)
VLDL: 29.2 mg/dL (ref 0.0–40.0)

## 2024-10-01 LAB — COMPREHENSIVE METABOLIC PANEL WITH GFR
ALT: 22 U/L (ref 0–35)
AST: 23 U/L (ref 5–37)
Albumin: 4.5 g/dL (ref 3.5–5.2)
Alkaline Phosphatase: 64 U/L (ref 39–117)
BUN: 14 mg/dL (ref 6–23)
CO2: 25 meq/L (ref 19–32)
Calcium: 9.6 mg/dL (ref 8.4–10.5)
Chloride: 103 meq/L (ref 96–112)
Creatinine, Ser: 0.67 mg/dL (ref 0.40–1.20)
GFR: 91.77 mL/min (ref >60.00)
Glucose, Bld: 91 mg/dL (ref 70–99)
Potassium: 4.1 meq/L (ref 3.5–5.1)
Sodium: 138 meq/L (ref 135–145)
Total Bilirubin: 0.6 mg/dL (ref 0.2–1.2)
Total Protein: 7.8 g/dL (ref 6.0–8.3)

## 2024-10-01 LAB — VITAMIN D 25 HYDROXY (VIT D DEFICIENCY, FRACTURES): VITD: 33.47 ng/mL (ref 30.00–100.00)

## 2024-10-01 LAB — HEMOGLOBIN A1C: Hgb A1c MFr Bld: 5.7 % (ref 4.6–6.5)

## 2024-10-01 LAB — TSH: TSH: 1.56 u[IU]/mL (ref 0.35–5.50)

## 2024-10-01 MED ORDER — BUDESONIDE-FORMOTEROL FUMARATE 80-4.5 MCG/ACT IN AERO: 2.0000 | INHALATION_SPRAY | Freq: Two times a day (BID) | RESPIRATORY_TRACT | 6 refills | Status: AC

## 2024-10-01 NOTE — Assessment & Plan Note (Signed)
 Problem has been well-controlled. Continue Symbicort 80-4.5 mcg twice daily as needed.

## 2024-10-01 NOTE — Patient Instructions (Addendum)
 A few things to remember from today's visit:  Routine general medical examination at a health care facility  Hypercholesterolemia - Plan: Comprehensive metabolic panel with GFR, Lipid panel  Right thyroid  nodule - Plan: TSH  Vitamin D  deficiency, unspecified - Plan: VITAMIN D  25 Hydroxy (Vit-D Deficiency, Fractures)  Asthma in adult, mild intermittent, uncomplicated  Prediabetes - Plan: Hemoglobin A1c No changes today. You need a bone density. It can be done in the same place you had your mammogram or at [facility]. Please let me know your preference.  If you need refills for medications you take chronically, please call your pharmacy. Do not use My Chart to request refills or for acute issues that need immediate attention. If you send a my chart message, it may take a few days to be addressed, specially if I am not in the office.  Please be sure medication list is accurate. If a new problem present, please set up appointment sooner than planned today.  Health Maintenance, Female Adopting a healthy lifestyle and getting preventive care are important in promoting health and wellness. Ask your health care provider about: The right schedule for you to have regular tests and exams. Things you can do on your own to prevent diseases and keep yourself healthy. What should I know about diet, weight, and exercise? Eat a healthy diet  Eat a diet that includes plenty of vegetables, fruits, low-fat dairy products, and lean protein. Do not eat a lot of foods that are high in solid fats, added sugars, or sodium. Maintain a healthy weight Body mass index (BMI) is used to identify weight problems. It estimates body fat based on height and weight. Your health care provider can help determine your BMI and help you achieve or maintain a healthy weight. Get regular exercise Get regular exercise. This is one of the most important things you can do for your health. Most adults should: Exercise for at  least 150 minutes each week. The exercise should increase your heart rate and make you sweat (moderate-intensity exercise). Do strengthening exercises at least twice a week. This is in addition to the moderate-intensity exercise. Spend less time sitting. Even light physical activity can be beneficial. Watch cholesterol and blood lipids Have your blood tested for lipids and cholesterol at 65 years of age, then have this test every 5 years. Have your cholesterol levels checked more often if: Your lipid or cholesterol levels are high. You are older than 65 years of age. You are at high risk for heart disease. What should I know about cancer screening? Depending on your health history and family history, you may need to have cancer screening at various ages. This may include screening for: Breast cancer. Cervical cancer. Colorectal cancer. Skin cancer. Lung cancer. What should I know about heart disease, diabetes, and high blood pressure? Blood pressure and heart disease High blood pressure causes heart disease and increases the risk of stroke. This is more likely to develop in people who have high blood pressure readings or are overweight. Have your blood pressure checked: Every 3-5 years if you are 34-95 years of age. Every year if you are 57 years old or older. Diabetes Have regular diabetes screenings. This checks your fasting blood sugar level. Have the screening done: Once every three years after age 71 if you are at a normal weight and have a low risk for diabetes. More often and at a younger age if you are overweight or have a high risk for diabetes. What  should I know about preventing infection? Hepatitis B If you have a higher risk for hepatitis B, you should be screened for this virus. Talk with your health care provider to find out if you are at risk for hepatitis B infection. Hepatitis C Testing is recommended for: Everyone born from [birth year] through 1965. Anyone with known risk  factors for hepatitis C. Sexually transmitted infections (STIs) Get screened for STIs, including gonorrhea and chlamydia, if: You are sexually active and are younger than 65 years of age. You are older than 65 years of age and your health care provider tells you that you are at risk for this type of infection. Your sexual activity has changed since you were last screened, and you are at increased risk for chlamydia or gonorrhea. Ask your health care provider if you are at risk. Ask your health care provider about whether you are at high risk for HIV. Your health care provider may recommend a prescription medicine to help prevent HIV infection. If you choose to take medicine to prevent HIV, you should first get tested for HIV. You should then be tested every 3 months for as long as you are taking the medicine. Pregnancy If you are about to stop having your period (premenopausal) and you may become pregnant, seek counseling before you get pregnant. Take 400 to 800 micrograms (mcg) of folic acid every day if you become pregnant. Ask for birth control (contraception) if you want to prevent pregnancy. Osteoporosis and menopause Osteoporosis is a disease in which the bones lose minerals and strength with aging. This can result in bone fractures. If you are 59 years old or older, or if you are at risk for osteoporosis and fractures, ask your health care provider if you should: Be screened for bone loss. Take a calcium or vitamin D  supplement to lower your risk of fractures. Be given hormone replacement therapy (HRT) to treat symptoms of menopause. Follow these instructions at home: Alcohol use Do not drink alcohol if: Your health care provider tells you not to drink. You are pregnant, may be pregnant, or are planning to become pregnant. If you drink alcohol: Limit how much you have to: 0-1 drink a day. Know how much alcohol is in your drink. In the [place] one drink equals one 12 oz bottle of beer  (355 mL), one 5 oz glass of wine (148 mL), or one 1 oz glass of hard liquor (44 mL). Lifestyle Do not use any products that contain nicotine or tobacco. These products include cigarettes, chewing tobacco, and vaping devices, such as e-cigarettes. If you need help quitting, ask your health care provider. Do not use street drugs. Do not share needles. Ask your health care provider for help if you need support or information about quitting drugs. General instructions Schedule regular health, dental, and eye exams. Stay current with your vaccines. Tell your health care provider if: You often feel depressed. You have ever been abused or do not feel safe at home. Summary Adopting a healthy lifestyle and getting preventive care are important in promoting health and wellness. Follow your health care provider's instructions about healthy diet, exercising, and getting tested or screened for diseases. Follow your health care provider's instructions on monitoring your cholesterol and blood pressure. This information is not intended to replace advice given to you by your health care provider. Make sure you discuss any questions you have with your health care provider. Document Revised: 02/22/2021 Document Reviewed: 02/22/2021 Elsevier Patient Education  2024 [employer].

## 2024-10-01 NOTE — Assessment & Plan Note (Signed)
Encouraged consistency with a healthy lifestyle for diabetes prevention. Further recommendation will be given according to hemoglobin A1c result. 

## 2024-10-01 NOTE — Assessment & Plan Note (Signed)
 We discussed the importance of regular physical activity and healthy diet for prevention of chronic illness and/or complications. Preventive guidelines reviewed. Vaccination updated today, Prevnar 20 given. She prefers to hold on DEXA order until she finds out about coverage. Ca++ and vit D supplementation to continue. Fall precaution discussed. Next CPE in a year.

## 2024-10-01 NOTE — Progress Notes (Signed)
 Discussed the use of AI scribe software for clinical note transcription with the patient, who gave verbal consent to proceed.  History of Present Illness Karen Ellis is a 63 year old female with a PMHx significant for vitamin D  deficiency, asthma, right thyroid  nodule seasonal allergies, and HLD, who is here today for her routine physical.  Last CPE: 09/29/2023. No new problems since her last visit.  She exercises regularly, aiming for three times a week at the gym, engaging in cardio activities such as bicycling, and using machines for arms and abdominals. She also participates in Zumba classes sometimes and walks around her living area when not at the gym.  Her diet consists of home-cooked meals with a focus on vegetables and meat, while minimizing carbohydrates. Breakfast typically includes oatmeal without sugar, sometimes accompanied by eggs and beans. She drinks coffee with breakfast and occasionally includes cheese.  She averages seven to eight hours of sleep per night.  Alcohol consumption is occasional, limited to special occasions like Christmas and Thanksgiving.  No hx of tobacco use. She regularly attends eye and dental check-ups.  Immunization History  Administered Date(s) Administered   Fluad Quad(high Dose 65+) 07/02/2024   Influenza Whole 08/16/2022   Influenza,inj,Quad PF,6+ Mos 07/31/2019, 08/26/2020   Influenza-Unspecified 07/06/2023   Moderna Covid-19 Fall Seasonal Vaccine 43yrs & older 07/06/2023   Moderna Sars-Covid-2 Vaccination 08/11/2020   Pneumococcal Polysaccharide-23 07/09/2012   Tdap 05/25/2007, 07/02/2018   Zoster Recombinant(Shingrix ) 09/26/2022, 01/11/2023   Health Maintenance  Topic Date Due   Medicare Annual Wellness (AWV)  Never done   HIV Screening  Never done   Bone Density Scan  Never done   COVID-19 Vaccine (3 - 2025-26 season) 10/17/2024 (Originally 06/17/2024)   Pneumococcal Vaccine: 50+ Years (2 of 2 - PCV) 10/01/2025  (Originally 07/09/2013)   Mammogram  08/30/2026   Cervical Cancer Screening (HPV/Pap Cotest)  09/27/2027   DTaP/Tdap/Td (3 - Td or Tdap) 07/02/2028   Colonoscopy  05/30/2034   Influenza Vaccine  Completed   Hepatitis C Screening  Completed   Zoster Vaccines- Shingrix   Completed   Hepatitis B Vaccines 19-59 Average Risk  Aged Out   Meningococcal B Vaccine  Aged Out   Hyperlipidemia: Not currently on pharmacologic treatment.  Lab Results  Component Value Date   CHOL 230 (H) 09/29/2023   HDL 68.20 09/29/2023   LDLCALC 135 (H) 09/29/2023   TRIG 136.0 09/29/2023   CHOLHDL 3 09/29/2023   Asthma: Currently on Symbicort  80-4.5 mcg 2 puffs twice prn. She has used her Albuterol  inhaler only twice in 2025 and uses Symbicort  as needed.  -Right sub centimeter thyroid  nodule seen on thyroid  US  in 2020, no further follow up was recommended. Lab Results  Component Value Date   TSH 0.98 09/29/2023   Hemoglobin A1c has been mildly elevated in the past, no history of diabetes. Lab Results  Component Value Date   HGBA1C 5.7 09/29/2023   Vit D deficiency:She takes vit D supplementation. Lab Results  Component Value Date   VD25OH 34.26 09/29/2023   Review of Systems  Constitutional:  Negative for activity change, appetite change and fever.  HENT:  Negative for mouth sores, sore throat and trouble swallowing.   Eyes:  Negative for redness and visual disturbance.  Respiratory:  Negative for cough, shortness of breath and wheezing.   Cardiovascular:  Negative for chest pain, palpitations and leg swelling.  Gastrointestinal:  Negative for abdominal pain, nausea and vomiting.  Endocrine: Negative for cold intolerance,  heat intolerance, polydipsia, polyphagia and polyuria.  Genitourinary:  Negative for decreased urine volume, dysuria, hematuria, vaginal bleeding and vaginal discharge.  Musculoskeletal:  Negative for gait problem and myalgias.  Skin:  Negative for color change and rash.   Allergic/Immunologic: Positive for environmental allergies.  Neurological:  Negative for syncope, weakness and headaches.  Hematological:  Negative for adenopathy. Does not bruise/bleed easily.  Psychiatric/Behavioral:  Negative for confusion. The patient is not nervous/anxious.   All other systems reviewed and are negative.  Current Outpatient Medications on File Prior to Visit  Medication Sig Dispense Refill   albuterol  (VENTOLIN  HFA) 108 (90 Base) MCG/ACT inhaler INHALE 2 PUFFS INTO THE LUNGS EVERY 4-6 HOURS AS NEEDED FOR WHEEZING/SHORTNESS OF BREATH. 6.7 each 2   aspirin EC 81 MG tablet Take 81 mg by mouth daily.     cholecalciferol (VITAMIN D ) 1000 UNITS tablet Take 1,000 Units by mouth daily.     fish oil-omega-3 fatty acids 1000 MG capsule Take 1 g by mouth daily.     loratadine (CLARITIN) 10 MG tablet Take 10 mg by mouth as needed for allergies.     Spacer/Aero-Holding Chambers (AEROCHAMBER PLUS) inhaler Use as instructed to use with inahaler. 1 each 1   No current facility-administered medications on file prior to visit.   Past Medical History:  Diagnosis Date   Allergy    Asthma    Past Surgical History:  Procedure Laterality Date   CESAREAN SECTION  1990   TUBAL LIGATION  1994   Allergies  Allergen Reactions   Morphine  And Codeine    Latex Rash   Family History  Problem Relation Age of Onset   Heart disease Father 30       CAD   Heart disease Daughter        CAD   Heart disease Brother        arrhythmias   Heart disease Brother 56       CAD   Breast cancer Neg Hx    BRCA 1/2 Neg Hx     Social History   Socioeconomic History   Marital status: Married    Spouse name: Not on file   Number of children: Not on file   Years of education: Not on file   Highest education level: Not on file  Occupational History   Not on file  Tobacco Use   Smoking status: Never   Smokeless tobacco: Never  Substance and Sexual Activity   Alcohol use: Yes    Comment:  occasionally   Drug use: No   Sexual activity: Not on file  Other Topics Concern   Not on file  Social History Narrative   Not on file   Social Drivers of Health   Tobacco Use: Low Risk (10/01/2024)   Patient History    Smoking Tobacco Use: Never    Smokeless Tobacco Use: Never    Passive Exposure: Not on file  Financial Resource Strain: Not on file  Food Insecurity: Not on file  Transportation Needs: Not on file  Physical Activity: Not on file  Stress: Not on file  Social Connections: Unknown (02/26/2022)   Received from [facility]   Social Network    Social Network: Not on file  Depression (PHQ2-9): Low Risk (10/01/2024)   Depression (PHQ2-9)    PHQ-2 Score: 0  Alcohol Screen: Not on file  Housing: Not on file  Utilities: Not on file  Health Literacy: Not on file   Vitals:   10/01/24 9173  BP: 118/76  Pulse: 88  Resp: 16  Temp: 98 F (36.7 C)  SpO2: 97%   Body mass index is 33.4 kg/m.  Wt Readings from Last 3 Encounters:  10/01/24 171 lb (77.6 kg)  08/06/24 170 lb 9.6 oz (77.4 kg)  03/12/24 165 lb 6.4 oz (75 kg)   Physical Exam Vitals and nursing note reviewed.  Constitutional:      General: She is not in acute distress.    Appearance: She is well-developed.  HENT:     Head: Normocephalic and atraumatic.     Right Ear: Tympanic membrane, ear canal and external ear normal.     Left Ear: Tympanic membrane, ear canal and external ear normal.     Mouth/Throat:     Mouth: Mucous membranes are moist.     Pharynx: Oropharynx is clear. Uvula midline.  Eyes:     Extraocular Movements: Extraocular movements intact.     Conjunctiva/sclera: Conjunctivae normal.     Pupils: Pupils are equal, round, and reactive to light.  Neck:     Thyroid : Thyromegaly present. No thyroid  mass.  Cardiovascular:     Rate and Rhythm: Normal rate and regular rhythm.     Pulses:          Dorsalis pedis pulses are 2+ on the right side and 2+ on the left side.     Heart sounds:  No murmur heard. Pulmonary:     Effort: Pulmonary effort is normal. No respiratory distress.     Breath sounds: Normal breath sounds.  Abdominal:     Palpations: Abdomen is soft. There is no hepatomegaly or mass.     Tenderness: There is no abdominal tenderness.  Genitourinary:    Comments: No concerns today. Musculoskeletal:     Right lower leg: No edema.     Left lower leg: No edema.     Comments: No signs of synovitis appreciated.  Lymphadenopathy:     Cervical: No cervical adenopathy.     Upper Body:     Right upper body: No supraclavicular adenopathy.     Left upper body: No supraclavicular adenopathy.  Skin:    General: Skin is warm.     Findings: No erythema or rash.  Neurological:     General: No focal deficit present.     Mental Status: She is alert and oriented to person, place, and time.     Cranial Nerves: No cranial nerve deficit.     Coordination: Coordination normal.     Gait: Gait normal.     Deep Tendon Reflexes:     Reflex Scores:      Bicep reflexes are 2+ on the right side and 2+ on the left side.      Patellar reflexes are 2+ on the right side and 2+ on the left side. Psychiatric:        Mood and Affect: Mood and affect normal.   ASSESSMENT AND PLAN:  Karen Ellis was here today for her annual physical examination.  Orders Placed This Encounter  Procedures   Pneumococcal conjugate vaccine 20-valent (Prevnar 20)   Comprehensive metabolic panel with GFR   VITAMIN D  25 Hydroxy (Vit-D Deficiency, Fractures)   Lipid panel   Hemoglobin A1c   TSH   Lab Results  Component Value Date   TSH 1.56 10/01/2024   Lab Results  Component Value Date   NA 138 10/01/2024   CL 103 10/01/2024   K 4.1 10/01/2024   CO2 25  10/01/2024   BUN 14 10/01/2024   CREATININE 0.67 10/01/2024   GFR 91.77 10/01/2024   CALCIUM 9.6 10/01/2024   ALBUMIN 4.5 10/01/2024   GLUCOSE 91 10/01/2024   Lab Results  Component Value Date   ALT 22 10/01/2024   AST  23 10/01/2024   ALKPHOS 64 10/01/2024   BILITOT 0.6 10/01/2024   Lab Results  Component Value Date   CHOL 239 (H) 10/01/2024   HDL 65.40 10/01/2024   LDLCALC 145 (H) 10/01/2024   TRIG 146.0 10/01/2024   CHOLHDL 4 10/01/2024   Lab Results  Component Value Date   HGBA1C 5.7 10/01/2024   Lab Results  Component Value Date   VD25OH 33.47 10/01/2024   The 10-year ASCVD risk score (Arnett DK, et al., 2019) is: 4.9%   Values used to calculate the score:     Age: 51 years     Clinically relevant sex: Female     Is Non-Hispanic African American: No     Diabetic: No     Tobacco smoker: No     Systolic Blood Pressure: 118 mmHg     Is BP treated: No     HDL Cholesterol: 65.4 mg/dL     Total Cholesterol: 239 mg/dL  Routine general medical examination at a health care facility Assessment & Plan: We discussed the importance of regular physical activity and healthy diet for prevention of chronic illness and/or complications. Preventive guidelines reviewed. Vaccination updated today, Prevnar 20 given. She prefers to hold on DEXA order until she finds out about coverage. Ca++ and vit D supplementation to continue. Fall precaution discussed. Next CPE in a year.   Hypercholesterolemia Assessment & Plan: Currently on nonpharmacologic treatment. Further recommendation will be given according to lipid panel result.  Orders: -     Comprehensive metabolic panel with GFR; Future -     Lipid panel; Future  Right thyroid  nodule Assessment & Plan: Stable. Last thyroid  US  in 2020, no further follow up was recommended.  Orders: -     TSH; Future  Vitamin D  deficiency, unspecified Assessment & Plan: She is on vitamin D  supplementation. Further recommendations will be given according to 25 OH vitamin D  result.  Orders: -     VITAMIN D  25 Hydroxy (Vit-D Deficiency, Fractures); Future  Asthma in adult, mild intermittent, uncomplicated Assessment & Plan: Problem has been  well-controlled. Continue Symbicort  80-4.5 mcg twice daily as needed.  Orders: -     Budesonide -Formoterol  Fumarate; Inhale 2 puffs into the lungs 2 (two) times daily.  Dispense: 10.2 g; Refill: 6  Prediabetes Assessment & Plan: Encouraged consistency with a healthy lifestyle for diabetes prevention. Further recommendation will be given according to hemoglobin A1c result.  Orders: -     Hemoglobin A1c; Future  Immunization due -     Pneumococcal conjugate vaccine 20-valent   Hawraa Stambaugh G. Alfretta Pinch, MD  [facility]. [place] office.

## 2024-10-01 NOTE — Assessment & Plan Note (Signed)
 Currently on nonpharmacologic treatment. Further recommendation will be given according to lipid panel result.

## 2024-10-01 NOTE — Assessment & Plan Note (Signed)
 She is on vitamin D  supplementation. Further recommendations will be given according to 25 OH vitamin D  result.

## 2024-10-01 NOTE — Assessment & Plan Note (Signed)
 Stable. Last thyroid  US  in 2020, no further follow up was recommended.

## 2024-11-04 ENCOUNTER — Ambulatory Visit: Admitting: Family Medicine

## 2024-11-04 ENCOUNTER — Encounter: Payer: Self-pay | Admitting: Family Medicine

## 2024-11-04 VITALS — BP 122/72 | HR 80 | Temp 98.0°F | Resp 16 | Ht 60.0 in | Wt 166.8 lb

## 2024-11-04 DIAGNOSIS — L02215 Cutaneous abscess of perineum: Secondary | ICD-10-CM

## 2024-11-04 DIAGNOSIS — Z780 Asymptomatic menopausal state: Secondary | ICD-10-CM

## 2024-11-04 DIAGNOSIS — K6289 Other specified diseases of anus and rectum: Secondary | ICD-10-CM | POA: Diagnosis not present

## 2024-11-04 MED ORDER — AMOXICILLIN-POT CLAVULANATE 875-125 MG PO TABS: 1.0000 | ORAL_TABLET | Freq: Two times a day (BID) | ORAL | 0 refills | Status: AC

## 2024-11-04 MED ORDER — IBUPROFEN 600 MG PO TABS: 600.0000 mg | ORAL_TABLET | Freq: Three times a day (TID) | ORAL | 0 refills | Status: AC

## 2024-11-04 MED ORDER — HYDROCORTISONE ACETATE 25 MG RE SUPP: 25.0000 mg | Freq: Two times a day (BID) | RECTAL | 0 refills | Status: AC

## 2024-11-04 NOTE — Patient Instructions (Addendum)
 A few things to remember from today's visit:  Asymptomatic postmenopausal estrogen deficiency - Plan: DG Bone Density  Perianal pain  Perineal abscess  Abscess vs cellulitis, so we are starting antibiotics. Start Augmentin  2 times daily. Ibuprofen  with food. Suppositories of hydrocortisone  may help but most important sitz baths as instructed.   If you need refills for medications you take chronically, please call your pharmacy. Do not use My Chart to request refills or for acute issues that need immediate attention. If you send a my chart message, it may take a few days to be addressed, specially if I am not in the office.  Please be sure medication list is accurate. If a new problem present, please set up appointment sooner than planned today.

## 2024-11-04 NOTE — Progress Notes (Signed)
 "  ACUTE VISIT Chief Complaint  Patient presents with   Rectal Pain    Rectal pain when having a bowel movements x 13 days - worsening - uncomfortable to sit.    Discussed the use of AI scribe software for clinical note transcription with the patient, who gave verbal consent to proceed. History of Present Illness Karen Ellis is a 64 year old female with past medical history significant for vitamin D  deficiency, asthma, seasonal allergies, and hyperlipidemia who presents with rectal pain as described above.  She has been experiencing rectal pain for the past week, which began after straining when having a bowel movement do to constipation. Initially mild, the pain has progressively worsened and exacerbated when passing stool and when sitting. There is no associated bleeding, and she has never experienced similar symptoms before.  She has a history of constipation and believes that straining may have contributed to her current symptoms. She has been using stool softeners, which have made her bowel movements softer, but the pain persists.  No fever,chills, myalgias,abdominal pain, nausea, or vomiting. No hx of trauma.   She reports her last colonoscopy was in August 2025 and  reports that polypectomy was performed. She recalls being informed about external hemorrhoids during a previous colonoscopy. She has been using over-the-counter hemorrhoid creams and wipes, and performs sitz baths three times a day for 15 minutes each.  She has not taken any pain medication due to concerns about constipation. She mentions that she was traveling recently and did not drink much water, which she believes may have contributed to her constipation.  Review of Systems  Constitutional:  Positive for activity change. Negative for appetite change, chills and unexpected weight change.  HENT:  Negative for mouth sores, sore throat and trouble swallowing.   Cardiovascular:  Negative for chest pain and leg  swelling.  Genitourinary:  Negative for decreased urine volume, dysuria, hematuria, vaginal bleeding and vaginal discharge.  Skin:  Negative for rash.  Neurological:  Negative for syncope and weakness.  Psychiatric/Behavioral:  Negative for confusion and hallucinations.   See other pertinent positives and negatives in HPI.  Medications Ordered Prior to Encounter[1]  Past Medical History:  Diagnosis Date   Allergy    Asthma    Allergies[2]  Social History   Socioeconomic History   Marital status: Married    Spouse name: Not on file   Number of children: Not on file   Years of education: Not on file   Highest education level: Not on file  Occupational History   Not on file  Tobacco Use   Smoking status: Never   Smokeless tobacco: Never  Substance and Sexual Activity   Alcohol use: Yes    Comment: occasionally   Drug use: No   Sexual activity: Not on file  Other Topics Concern   Not on file  Social History Narrative   Not on file   Social Drivers of Health   Tobacco Use: Low Risk (11/04/2024)   Patient History    Smoking Tobacco Use: Never    Smokeless Tobacco Use: Never    Passive Exposure: Not on file  Financial Resource Strain: Not on file  Food Insecurity: Not on file  Transportation Needs: Not on file  Physical Activity: Not on file  Stress: Not on file  Social Connections: Unknown (02/26/2022)   Received from [facility]   Social Network    Social Network: Not on file  Depression (PHQ2-9): Low Risk (10/01/2024)  Depression (PHQ2-9)    PHQ-2 Score: 0  Alcohol Screen: Not on file  Housing: Not on file  Utilities: Not on file  Health Literacy: Not on file    Vitals:   11/04/24 1101  BP: 122/72  Pulse: 80  Resp: 16  Temp: 98 F (36.7 C)  SpO2: 97%   Body mass index is 32.58 kg/m.  Physical Exam Vitals and nursing note reviewed. Exam conducted with a chaperone present.  Constitutional:      General: She is not in acute distress.     Appearance: She is well-developed.  HENT:     Head: Normocephalic and atraumatic.  Eyes:     Conjunctiva/sclera: Conjunctivae normal.  Cardiovascular:     Rate and Rhythm: Normal rate and regular rhythm.     Heart sounds: No murmur heard. Pulmonary:     Effort: Pulmonary effort is normal. No respiratory distress.     Breath sounds: Normal breath sounds.  Genitourinary:    Exam position: Knee-chest position.     Rectum: Tenderness present. No mass or anal fissure. Normal anal tone.     Comments: Perianal erythema and local heat around right side. Lying on her left side, some induration noted on right aspect of perianal area with significant tenderness.  Perianal tag , minimally tender noted. No external hemorrhoid appreciate on inspection or with digital examination, the latter one limited due to pain. I did not appreciate fissures. Lymphadenopathy:     Cervical: No cervical adenopathy.  Skin:    General: Skin is warm.     Findings: No erythema or rash.  Neurological:     Mental Status: She is alert and oriented to person, place, and time.  Psychiatric:        Mood and Affect: Mood and affect normal.   ASSESSMENT AND PLAN:  [name] was seen today for rectal pain.  Diagnoses and all orders for this visit: Orders Placed This Encounter  Procedures   DG Bone Density   Perianal pain We discussed possible etiologies. It has not improved with OTC hemorrhoid medications have not helped. Anusol  suppository may help. Ibuprofen  600 mg tid for 7-10 days. Bath sitz daily until problem improves. Monitor for new symptoms..  -     hydrocortisone  (ANUSOL -HC) 25 MG suppository; Place 1 suppository (25 mg total) rectally 2 (two) times daily for 7 days. -     ibuprofen  (ADVIL ) 600 MG tablet; Take 1 tablet (600 mg total) by mouth 3 (three) times daily with meals for 10 days.  Perineal abscess Perianal cellulitis vs abscess. No fluctuant mass appreciated, indurated very tender area  with local heat. Recommend Augmentin . Clearly instructed about warning signs. F/U in 4-5 days.  -     amoxicillin -clavulanate (AUGMENTIN ) 875-125 MG tablet; Take 1 tablet by mouth 2 (two) times daily for 7 days.  Asymptomatic postmenopausal estrogen deficiency -     DG Bone Density; Future  Return in about 5 days (around 11/09/2024).  Emile Kyllo G. Mackenzie Groom, MD  [facility]. [place] office.     [1]  Current Outpatient Medications on File Prior to Visit  Medication Sig Dispense Refill   albuterol  (VENTOLIN  HFA) 108 (90 Base) MCG/ACT inhaler INHALE 2 PUFFS INTO THE LUNGS EVERY 4-6 HOURS AS NEEDED FOR WHEEZING/SHORTNESS OF BREATH. 6.7 each 2   aspirin EC 81 MG tablet Take 81 mg by mouth daily.     budesonide -formoterol  (SYMBICORT ) 80-4.5 MCG/ACT inhaler Inhale 2 puffs into the lungs 2 (two) times daily. 10.2  g 6   cholecalciferol (VITAMIN D ) 1000 UNITS tablet Take 1,000 Units by mouth daily.     fish oil-omega-3 fatty acids 1000 MG capsule Take 1 g by mouth daily.     loratadine (CLARITIN) 10 MG tablet Take 10 mg by mouth as needed for allergies.     Spacer/Aero-Holding Chambers (AEROCHAMBER PLUS) inhaler Use as instructed to use with inahaler. 1 each 1   No current facility-administered medications on file prior to visit.  [2]  Allergies Allergen Reactions   Morphine  And Codeine    Latex Rash   "

## 2024-11-06 ENCOUNTER — Telehealth: Payer: Self-pay

## 2024-11-06 ENCOUNTER — Other Ambulatory Visit (HOSPITAL_COMMUNITY): Payer: Self-pay

## 2024-11-06 NOTE — Telephone Encounter (Signed)
 Pharmacy Patient Advocate Encounter   Received notification from [facility] KEY that prior authorization for Anusol -HC 25 mg supp is required/requested.   Insurance verification completed.   The patient is insured through [facility].   Per test claim: PA required; PA submitted to above mentioned insurance via Latent Key/confirmation #/EOC [id number] Status is pending

## 2024-11-08 ENCOUNTER — Ambulatory Visit: Admitting: Family Medicine

## 2024-11-08 ENCOUNTER — Encounter: Payer: Self-pay | Admitting: Family Medicine

## 2024-11-08 VITALS — BP 128/88 | HR 89 | Temp 98.2°F | Resp 16 | Ht 60.0 in | Wt 168.4 lb

## 2024-11-08 DIAGNOSIS — K5900 Constipation, unspecified: Secondary | ICD-10-CM | POA: Diagnosis not present

## 2024-11-08 DIAGNOSIS — L02215 Cutaneous abscess of perineum: Secondary | ICD-10-CM | POA: Diagnosis not present

## 2024-11-08 NOTE — Patient Instructions (Addendum)
 A few things to remember from today's visit:  Perineal abscess  Constipation, unspecified constipation type Completed antibiotic treatment. Continue sitz bath's as needed. Avoid constipation.

## 2024-11-08 NOTE — Progress Notes (Signed)
 "   Chief Complaint  Patient presents with   Follow-up    5 day follow-up    Discussed the use of AI scribe software for clinical note transcription with the patient, who gave verbal consent to proceed.  History of Present Illness Karen Ellis is a 64 year old female with PMHx significant for vitamin D  deficiency, asthma, seasonal allergies, and hyperlipidemia here today to follow on rectal pain. She was seen for this problem on 11/05/23. She started abx treatment to treat possible perianal cellulitis vx abscess. 5th day of Augmentin , which she has tolerated well.  She is also doing sitz bath and using Anusol  suppositories. She reports significant improvement in her symptoms, with pain now rated as 1 out of 10, allowing her to sit comfortably and apply suppositories without pain.  No fever, chills, nausea, vomiting, blood in stool, melena, or urinary symptoms. She experienced one episode of loose stool, which she attributes to the antibiotic use. No associated abdominal pain. Constipation, which has improved with increased of water intake. She takes Miralax as needed.  No new concerns today.  Review of Systems  Respiratory:  Negative for shortness of breath.   Cardiovascular:  Negative for chest pain and leg swelling.  Endocrine: Negative for cold intolerance and heat intolerance.  Genitourinary:  Negative for decreased urine volume, dysuria and hematuria.  Musculoskeletal:  Negative for joint swelling and myalgias.  Skin:  Negative for rash.  Neurological:  Negative for syncope and weakness.  See other pertinent positives and negatives in HPI.  Medications Ordered Prior to Encounter[1]  Past Medical History:  Diagnosis Date   Allergy    Asthma    Allergies[2]  Social History   Socioeconomic History   Marital status: Married    Spouse name: Not on file   Number of children: Not on file   Years of education: Not on file   Highest education level:  Not on file  Occupational History   Not on file  Tobacco Use   Smoking status: Never   Smokeless tobacco: Never  Substance and Sexual Activity   Alcohol use: Yes    Comment: occasionally   Drug use: No   Sexual activity: Not on file  Other Topics Concern   Not on file  Social History Narrative   Not on file   Social Drivers of Health   Tobacco Use: Low Risk (11/08/2024)   Patient History    Smoking Tobacco Use: Never    Smokeless Tobacco Use: Never    Passive Exposure: Not on file  Financial Resource Strain: Not on file  Food Insecurity: Not on file  Transportation Needs: Not on file  Physical Activity: Not on file  Stress: Not on file  Social Connections: Unknown (02/26/2022)   Received from [facility]   Social Network    Social Network: Not on file  Depression (PHQ2-9): Low Risk (10/01/2024)   Depression (PHQ2-9)    PHQ-2 Score: 0  Alcohol Screen: Not on file  Housing: Not on file  Utilities: Not on file  Health Literacy: Not on file    Vitals:   11/08/24 1103  BP: 128/88  Pulse: 89  Resp: 16  Temp: 98.2 F (36.8 C)  SpO2: 97%   Body mass index is 32.89 kg/m.  Physical Exam Vitals and nursing note reviewed. Exam conducted with a chaperone present.  Constitutional:      Appearance: Normal appearance. She is not ill-appearing.  HENT:     Head: Normocephalic  and atraumatic.  Eyes:     Conjunctiva/sclera: Conjunctivae normal.  Cardiovascular:     Rate and Rhythm: Normal rate and regular rhythm.     Heart sounds: No murmur heard. Pulmonary:     Effort: Pulmonary effort is normal. No respiratory distress.     Breath sounds: Normal breath sounds.  Abdominal:     Palpations: Abdomen is soft. There is no mass.     Tenderness: There is no abdominal tenderness.  Genitourinary:    Rectum: No mass, tenderness or external hemorrhoid.     Comments: Perianal skin tag, not tender or edematous. Musculoskeletal:     Right lower leg: No edema.      Left lower leg: No edema.  Skin:    Findings: No erythema or rash.  Neurological:     Mental Status: She is alert and oriented to person, place, and time.     Gait: Gait normal.  Psychiatric:        Mood and Affect: Mood and affect normal.   ASSESSMENT AND PLAN:  Karen Ellis was seen today for follow-up.  Diagnoses and all orders for this visit:  Constipation, unspecified constipation type It has improved. Reports episode of loose stools after starting abx.  Continue adequate hydration and fiber intake. Miralax daily as needed, may hold on it until completing abx treatment.  Perineal abscess Perianal pain has treatment improved as well as clinical findings. Not longer tenderness with palpation or induration/erythema on examination. Complete abx treatment. Continue sitz bath daily as needed. Instructed about warning signs.  We discussed Dx and prognosis, if she develops another episode, we may need surgical consultation.  Return if symptoms worsen or fail to improve, for keep next appointment.  Karen Acy G. Kaytlyn Din, MD  [facility]. [place] office.      [1] Current Outpatient Medications on File Prior to Visit  Medication Sig Dispense Refill   albuterol  (VENTOLIN  HFA) 108 (90 Base) MCG/ACT inhaler INHALE 2 PUFFS INTO THE LUNGS EVERY 4-6 HOURS AS NEEDED FOR WHEEZING/SHORTNESS OF BREATH. 6.7 each 2   amoxicillin -clavulanate (AUGMENTIN ) 875-125 MG tablet Take 1 tablet by mouth 2 (two) times daily for 7 days. 14 tablet 0   aspirin EC 81 MG tablet Take 81 mg by mouth daily.     budesonide -formoterol  (SYMBICORT ) 80-4.5 MCG/ACT inhaler Inhale 2 puffs into the lungs 2 (two) times daily. 10.2 g 6   cholecalciferol (VITAMIN D ) 1000 UNITS tablet Take 1,000 Units by mouth daily.     fish oil-omega-3 fatty acids 1000 MG capsule Take 1 g by mouth daily.     hydrocortisone  (ANUSOL -HC) 25 MG suppository Place 1 suppository (25 mg total) rectally 2 (two) times daily  for 7 days. 14 suppository 0   ibuprofen  (ADVIL ) 600 MG tablet Take 1 tablet (600 mg total) by mouth 3 (three) times daily with meals for 10 days. 30 tablet 0   loratadine (CLARITIN) 10 MG tablet Take 10 mg by mouth as needed for allergies.     Spacer/Aero-Holding Chambers (AEROCHAMBER PLUS) inhaler Use as instructed to use with inahaler. 1 each 1   No current facility-administered medications on file prior to visit.  [2] Allergies Allergen Reactions   Morphine  And Codeine    Latex Rash  "
